# Patient Record
Sex: Female | Born: 1965 | Race: White | Hispanic: No | Marital: Married | Smoking: Never smoker
Health system: Southern US, Community
[De-identification: ages and names within clinical notes are randomized; demographics above are authoritative.]

## PROBLEM LIST (undated history)

## (undated) DIAGNOSIS — M419 Scoliosis, unspecified: Secondary | ICD-10-CM

## (undated) DIAGNOSIS — N939 Abnormal uterine and vaginal bleeding, unspecified: Secondary | ICD-10-CM

## (undated) DIAGNOSIS — T4145XA Adverse effect of unspecified anesthetic, initial encounter: Secondary | ICD-10-CM

## (undated) DIAGNOSIS — Z9889 Other specified postprocedural states: Secondary | ICD-10-CM

## (undated) DIAGNOSIS — T8859XA Other complications of anesthesia, initial encounter: Secondary | ICD-10-CM

## (undated) DIAGNOSIS — R112 Nausea with vomiting, unspecified: Secondary | ICD-10-CM

## (undated) HISTORY — PX: DILATION AND CURETTAGE OF UTERUS: SHX78

---

## 2005-09-15 ENCOUNTER — Ambulatory Visit: Payer: Self-pay

## 2007-09-06 ENCOUNTER — Ambulatory Visit: Payer: Self-pay

## 2009-09-23 ENCOUNTER — Ambulatory Visit: Payer: Self-pay

## 2010-11-07 ENCOUNTER — Ambulatory Visit: Payer: Self-pay

## 2010-11-14 ENCOUNTER — Ambulatory Visit: Payer: Self-pay

## 2011-05-18 ENCOUNTER — Ambulatory Visit: Payer: Self-pay

## 2011-12-24 ENCOUNTER — Ambulatory Visit: Payer: Self-pay

## 2012-12-27 ENCOUNTER — Ambulatory Visit: Payer: Self-pay

## 2013-01-10 ENCOUNTER — Ambulatory Visit: Payer: Self-pay

## 2013-09-20 ENCOUNTER — Ambulatory Visit: Payer: Self-pay | Admitting: Obstetrics and Gynecology

## 2013-09-20 LAB — CBC
HGB: 11.5 g/dL — ABNORMAL LOW (ref 12.0–16.0)
MCHC: 34.7 g/dL (ref 32.0–36.0)
MCV: 86 fL (ref 80–100)
Platelet: 386 10*3/uL (ref 150–440)
RDW: 12.2 % (ref 11.5–14.5)
WBC: 7.5 10*3/uL (ref 3.6–11.0)

## 2013-09-20 LAB — BASIC METABOLIC PANEL
Anion Gap: 3 — ABNORMAL LOW (ref 7–16)
BUN: 12 mg/dL (ref 7–18)
Calcium, Total: 8.9 mg/dL (ref 8.5–10.1)
Chloride: 107 mmol/L (ref 98–107)
Creatinine: 0.71 mg/dL (ref 0.60–1.30)
EGFR (Non-African Amer.): >60
Glucose: 82 mg/dL (ref 65–99)
Osmolality: 275 (ref 275–301)
Potassium: 3.7 mmol/L (ref 3.5–5.1)

## 2013-09-26 ENCOUNTER — Ambulatory Visit: Payer: Self-pay | Admitting: Obstetrics and Gynecology

## 2014-01-29 ENCOUNTER — Ambulatory Visit: Payer: Self-pay | Admitting: Obstetrics and Gynecology

## 2015-03-01 NOTE — Op Note (Signed)
PATIENT NAME:  Kathleen Moses, Kathleen Moses MR#:  [id number] DATE OF BIRTH:  Nov 18, 1965  DATE OF PROCEDURE:  09/26/2013  PREOPERATIVE DIAGNOSES:  1.  Menorrhagia with endometrial polyp noted on office ultrasound. 2.  Pelvic pain.  3.  Infertility.  POSTOPERATIVE DIAGNOSES: 1.  Menorrhagia, without evidence of endometrial polyp. 2.  Pelvic pain. 3.  Infertility.   OPERATION PERFORMED:  1.  Hysteroscopy.  2.  Dilatation and curettage. 3.  Diagnostic laparoscopy with chromopertubation.   ANESTHESIA USED: General.   PRIMARY SURGEON: Vena AustriaAndreas Naomie Crow, MD.   ESTIMATED BLOOD LOSS: Minimal.   COMPLICATIONS: None.   FINDINGS: Normal intra-abdominal anatomy. Free spill of methylene blue dye on the left tube, none noted on the right tube. The right tube was grossly normal in appearance other than a small cyst of Morganii near the fimbriated end. The remainder of the intra-abdominal anatomy was grossly normal with no evidence of endometriosis. The hysteroscopy portion of the case revealed fluffy, shaggy-appearing endometrium without evidence of endometrial polyp. Following the dilation and curettage, the cavity was assessed and noted to be normal in contour as was the cervical canal.   SPECIMENS REMOVED: Endometrial curettings.   PATIENT CONDITION FOLLOWING PROCEDURE: Stable.   PROCEDURE IN DETAIL: Risks, benefits, and alternatives of the procedure were discussed with the patient prior to proceeding to the operating room. The patient was taken to the operating room where she was placed under general endotracheal anesthesia. She was positioned in the dorsal lithotomy position using Allen stirrups, prepped and draped in the usual sterile fashion. A timeout was performed. Attention was turned to the patient's pelvis. The bladder was straight catheterized with a red rubber catheter. An operative speculum was then placed. The anterior lip of the cervix was grasped with a single-tooth tenaculum and an acorn cannula  was placed through the cervical os. The operative speculum was removed.   Attention was turned to the patient's abdomen. The umbilicus was incised with 11 blade scalpel and peritoneal entry was gained using direct visual visualization technique with a 5 mm XL trocar. Once entry into the peritoneal cavity had been established, pneumoperitoneum was established. A left 5 mm assistant port was then placed under direct visualization noting the above findings. Chromopertubation revealed pre-spill on the left tube and no spill was noted on the right tube, although the right tube is grossly normal in appearance. Following this, the pneumoperitoneum was evacuated, the trocars were removed and the trocar sites were dressed with Dermabond.   Attention was once again turned to the patient's pelvis. The single-tooth tenaculum and acorn cannula were removed. The cervix was sequentially dilated using Hegar dilators. A hysteroscopy was then performed. The initial hysteroscopy was somewhat limited because of fluffy, shaggy appearance of the endometrium. A D and C yielded moderate amounts of tissue. Post D and C hysteroscopy revealed normal cavity contour. Both tubal ostia were visualized and appeared normal. The cervix was also normal in appearance. The patient tolerated the procedure well and was taken to the recovery room in stable condition.   ____________________________ Florina OuAndreas M. Bonney AidStaebler, MD ams:aw D: 09/27/2013 08:22:12 ET T: 09/27/2013 08:48:06 ET JOB#: [id number]  cc: Florina OuAndreas M. Bonney AidStaebler, MD, <Dictator> Carmel SacramentoANDREAS Cathrine MusterM Rodriques Badie MD ELECTRONICALLY SIGNED 10/09/2013 8:46

## 2015-08-14 ENCOUNTER — Other Ambulatory Visit: Payer: Self-pay | Admitting: Obstetrics and Gynecology

## 2015-08-14 DIAGNOSIS — Z1231 Encounter for screening mammogram for malignant neoplasm of breast: Secondary | ICD-10-CM

## 2015-08-16 ENCOUNTER — Ambulatory Visit
Admission: RE | Admit: 2015-08-16 | Discharge: 2015-08-16 | Disposition: A | Discharge status: Home / Self Care | Payer: BC Managed Care – PPO | Source: Ambulatory Visit | Attending: Obstetrics and Gynecology | Admitting: Obstetrics and Gynecology

## 2015-08-16 DIAGNOSIS — Z1231 Encounter for screening mammogram for malignant neoplasm of breast: Secondary | ICD-10-CM | POA: Diagnosis present

## 2016-08-27 ENCOUNTER — Other Ambulatory Visit: Payer: Self-pay | Admitting: Obstetrics and Gynecology

## 2016-08-27 DIAGNOSIS — Z1231 Encounter for screening mammogram for malignant neoplasm of breast: Secondary | ICD-10-CM

## 2016-09-08 ENCOUNTER — Encounter: Payer: Self-pay | Admitting: Radiology

## 2016-09-08 ENCOUNTER — Ambulatory Visit
Admission: RE | Admit: 2016-09-08 | Discharge: 2016-09-08 | Disposition: A | Discharge status: Home / Self Care | Payer: BC Managed Care – PPO | Source: Ambulatory Visit | Attending: Obstetrics and Gynecology | Admitting: Obstetrics and Gynecology

## 2016-09-08 DIAGNOSIS — Z1231 Encounter for screening mammogram for malignant neoplasm of breast: Secondary | ICD-10-CM | POA: Insufficient documentation

## 2016-11-09 DIAGNOSIS — N939 Abnormal uterine and vaginal bleeding, unspecified: Secondary | ICD-10-CM

## 2016-11-09 HISTORY — DX: Abnormal uterine and vaginal bleeding, unspecified: N93.9

## 2016-12-10 ENCOUNTER — Encounter: Payer: Self-pay | Admitting: *Deleted

## 2016-12-10 ENCOUNTER — Ambulatory Visit
Admission: EM | Admit: 2016-12-10 | Discharge: 2016-12-10 | Disposition: A | Discharge status: Home / Self Care | Payer: BC Managed Care – PPO | Attending: Family Medicine | Admitting: Family Medicine

## 2016-12-10 DIAGNOSIS — J069 Acute upper respiratory infection, unspecified: Secondary | ICD-10-CM

## 2016-12-10 DIAGNOSIS — J040 Acute laryngitis: Secondary | ICD-10-CM

## 2016-12-10 NOTE — ED Triage Notes (Signed)
Non-productive cough, loss of voice, congestion, x2 days. Denies fever.

## 2016-12-10 NOTE — Discharge Instructions (Signed)
Recommend continue Vicks Vapor Rub and moist heat/steam to help with symptoms. May use throat lozenges as needed. Recommend follow-up with your primary care provider in 4 to 5 days if not improving or if symptoms worsen.

## 2016-12-10 NOTE — ED Provider Notes (Signed)
CSN: [id number]     Arrival date & time 12/10/16  1127 History   First MD Initiated Contact with Patient 12/10/16 1207     Chief Complaint  Patient presents with  . Cough  . Nasal Congestion  . Hoarse   (Consider location/radiation/quality/duration/timing/severity/associated sxs/prior Treatment) 51 year old female presents with sneezing, loss of voice, slight nasal congestion and dry cough for the past 2 days. Denies any fever, sore throat, or GI symptoms. Has used Vicks Vapor Rub with some relief. Husband has been sick with more severe URI symptoms but his symptoms are resolving. She has a history of recurrent sinus infections but otherwise no chronic health issues.    The history is provided by the patient.    History reviewed. No pertinent past medical history. Past Surgical History:  Procedure Laterality Date  . DILATION AND CURETTAGE OF UTERUS     Family History  Problem Relation Age of Onset  . Breast cancer Neg Hx    Social History  Substance Use Topics  . Smoking status: Never Smoker  . Smokeless tobacco: Never Used  . Alcohol use No   OB History    No data available     Review of Systems  Constitutional: Negative for appetite change, chills, fatigue and fever.  HENT: Positive for congestion, postnasal drip, sneezing and voice change. Negative for ear pain, rhinorrhea, sinus pain, sinus pressure and sore throat.   Eyes: Negative for discharge.  Respiratory: Positive for cough. Negative for chest tightness, shortness of breath and wheezing.   Cardiovascular: Negative for chest pain.  Gastrointestinal: Negative for diarrhea, nausea and vomiting.  Musculoskeletal: Negative for arthralgias, myalgias, neck pain and neck stiffness.  Skin: Negative for rash.  Neurological: Negative for dizziness, syncope, weakness, light-headedness, numbness and headaches.  Hematological: Negative for adenopathy.    Allergies  Patient has no known allergies.  Home Medications    Prior to Admission medications   Medication Sig Start Date End Date Taking? Authorizing Provider  cholecalciferol (VITAMIN D) 1000 units tablet Take 1,000 Units by mouth daily.   Yes Historical Provider, MD  loratadine (CLARITIN) 10 MG tablet Take 10 mg by mouth daily.   Yes Historical Provider, MD   Meds Ordered and Administered this Visit  Medications - No data to display  BP 117/73 (BP Location: Left Arm)   Pulse 73   Temp 99.1 F (37.3 C) (Oral)   Resp 16   Ht 5\' 2"  (1.575 m)   Wt 128 lb (58.1 kg)   SpO2 100%   BMI 23.41 kg/m  No data found.   Physical Exam  Constitutional: She is oriented to person, place, and time. She appears well-developed and well-nourished. She does not appear ill. No distress.  HENT:  Head: Normocephalic and atraumatic.  Right Ear: Hearing, tympanic membrane, external ear and ear canal normal.  Left Ear: Hearing, tympanic membrane, external ear and ear canal normal.  Nose: Nose normal. Right sinus exhibits no maxillary sinus tenderness and no frontal sinus tenderness. Left sinus exhibits no maxillary sinus tenderness and no frontal sinus tenderness.  Mouth/Throat: Uvula is midline and mucous membranes are normal. No posterior oropharyngeal erythema (slight irritation- not red).  Neck: Normal range of motion. Neck supple.  Cardiovascular: Normal rate, regular rhythm and normal heart sounds.   Pulmonary/Chest: Effort normal and breath sounds normal. No respiratory distress. She has no decreased breath sounds. She has no wheezes. She has no rhonchi. She has no rales.  Lymphadenopathy:    She  has no cervical adenopathy.  Neurological: She is alert and oriented to person, place, and time.  Skin: Skin is warm and dry. Capillary refill takes less than 2 seconds.  Psychiatric: She has a normal mood and affect. Her behavior is normal. Judgment and thought content normal.    Urgent Care Course     Procedures (including critical care time)  Labs  Review Labs Reviewed - No data to display  Imaging Review No results found.   Visual Acuity Review  Right Eye Distance:   Left Eye Distance:   Bilateral Distance:    Right Eye Near:   Left Eye Near:    Bilateral Near:         MDM   1. Laryngitis   2. Acute upper respiratory infection    Discussed with patient that she appears to have a mild viral illness. Did not perform a strep test since throat is not sore, no fever or swollen lymph nodes. Reassurance provided that laryngitis should improve over the next few days. Recommend continue Vicks Vapor Rub and moist heat/steam to help with symptoms. May use throat lozenges as needed. Follow-up with her primary care provider in 4 to 5 days if not improving.      Sudie Grumbling, NP 12/10/16 2139

## 2017-04-06 ENCOUNTER — Encounter: Payer: Self-pay | Admitting: Obstetrics and Gynecology

## 2017-04-06 ENCOUNTER — Ambulatory Visit (INDEPENDENT_AMBULATORY_CARE_PROVIDER_SITE_OTHER): Payer: BC Managed Care – PPO | Admitting: Obstetrics and Gynecology

## 2017-04-06 VITALS — BP 110/70 | HR 77 | Ht 62.0 in | Wt 131.0 lb

## 2017-04-06 DIAGNOSIS — N930 Postcoital and contact bleeding: Secondary | ICD-10-CM

## 2017-04-06 DIAGNOSIS — N924 Excessive bleeding in the premenopausal period: Secondary | ICD-10-CM | POA: Diagnosis not present

## 2017-04-06 NOTE — Progress Notes (Signed)
Obstetrics & Gynecology Office Visit   Chief Complaint:  Chief Complaint  Patient presents with  . Metrorrhagia    no pain/no heavy/no clots    History of Present Illness: 51 year old female presenting for irregular vaginal bleeding.  In past year had noted menses spacing out somewhat before returning to regular monthly.  Now has noted longer episodes of bleeding with duration lasting up to two week, no heavy, at times only bright red spotting, no associated dysmenorrhea.  Interval has also shortened sometime to as little as 21 days.  She does report some postcoital spotting.  No intermenstrual bleeding.  Her past medical history is notable for prior D&C for endometrial polyps.  Last pathology was obtained 2016 showing proliferative endometrium with benign endometrial polyp.  Pap 08/25/16 NIL HPV negative.  Some mild vasomotor symptoms   Review of Systems: 10 point review of systems negative unless otherwise noted in HPI  Past Medical History:  History reviewed. No pertinent past medical history.  Past Surgical History:  Past Surgical History:  Procedure Laterality Date  . DILATION AND CURETTAGE OF UTERUS      Gynecologic History: No LMP recorded. Patient is perimenopausal.  Obstetric History: G0P0000  Family History:  Family History  Problem Relation Age of Onset  . Breast cancer Neg Hx     Social History:  Social History   Social History  . Marital status: Married    Spouse name: N/A  . Number of children: N/A  . Years of education: N/A   Occupational History  . Not on file.   Social History Main Topics  . Smoking status: Never Smoker  . Smokeless tobacco: Never Used  . Alcohol use No  . Drug use: No  . Sexual activity: Yes    Birth control/ protection: None   Other Topics Concern  . Not on file   Social History Narrative  . No narrative on file    Allergies:  No Known Allergies  Medications: Prior to Admission medications   Medication Sig  Start Date End Date Taking? Authorizing Provider  aspirin EC 81 MG tablet Take by mouth.   Yes [provider]  cetirizine (ZYRTEC) 10 MG tablet Take by mouth.   Yes [provider]  cholecalciferol (VITAMIN D) 1000 units tablet Take 1,000 Units by mouth daily.   Yes [provider]  ibuprofen (ADVIL,MOTRIN) 200 MG tablet Take 200 mg by mouth.   Yes [provider]  loratadine (CLARITIN) 10 MG tablet Take 10 mg by mouth daily.   Yes [provider]    Physical Exam Vitals:  Vitals:   04/06/17 1408  BP: 110/70  Pulse: 77   No LMP recorded. Patient is perimenopausal.  General: NAD HEENT: normocephalic, anicteric Pulmonary: No increased work of breathing Genitourinary:  External: Normal external female genitalia.  Normal urethral meatus, normal  Bartholin's and Skene's glands.    Vagina: Normal vaginal mucosa, no evidence of prolapse.    Cervix: Grossly normal in appearance, no bleeding  Uterus: Non-enlarged, mobile, normal contour.  No CMT  Adnexa: ovaries non-enlarged, no adnexal masses  Rectal: deferred  Lymphatic: no evidence of inguinal lymphadenopathy Extremities: no edema, erythema, or tenderness Neurologic: Grossly intact Psychiatric: mood appropriate, affect full  Female chaperone present for pelvic and breast  portions of the physical exam  Assessment: 51 y.o. G0P0000 with menorrhagia  Plan: Problem List Items Addressed This Visit    None    Visit Diagnoses  Perimenopausal menorrhagia    -  Primary   Relevant Orders   US Transvaginal Non-OB   Postcoital and contact bleeding       Relevant Orders   US Transvaginal Non-OB     - Will start work up with TVUS, recommended endometrial biopsy of no focal lesion identified unless stripe is less than 4mm.

## 2017-04-26 ENCOUNTER — Ambulatory Visit (INDEPENDENT_AMBULATORY_CARE_PROVIDER_SITE_OTHER): Payer: BC Managed Care – PPO | Admitting: Obstetrics and Gynecology

## 2017-04-26 ENCOUNTER — Encounter: Payer: Self-pay | Admitting: Obstetrics and Gynecology

## 2017-04-26 ENCOUNTER — Ambulatory Visit (INDEPENDENT_AMBULATORY_CARE_PROVIDER_SITE_OTHER): Payer: BC Managed Care – PPO

## 2017-04-26 VITALS — BP 118/78 | HR 84 | Wt 130.0 lb

## 2017-04-26 DIAGNOSIS — N924 Excessive bleeding in the premenopausal period: Secondary | ICD-10-CM

## 2017-04-26 DIAGNOSIS — N930 Postcoital and contact bleeding: Secondary | ICD-10-CM

## 2017-04-26 DIAGNOSIS — N840 Polyp of corpus uteri: Secondary | ICD-10-CM

## 2017-04-26 NOTE — Progress Notes (Signed)
Gynecology Ultrasound Follow Up  Chief Complaint:  Chief Complaint  Patient presents with  . Gyn U/S     History of Present Illness: Patient is a 51 y.o. female who presents today for ultrasound evaluation of abnormal uterine/postcoital bleeding .  Ultrasound demonstrates the following findgins Adnexa: no masses seen consistency normal Uterus: Non-enlarged with endometrial stripe  8mm and what appears to be a small LUS endometrial polyp Additional: no free fluid  Review of Systems: Review of Systems  Constitutional: Negative for chills and fever.  HENT: Negative for congestion.   Respiratory: Negative for cough and shortness of breath.   Cardiovascular: Negative for chest pain and palpitations.  Gastrointestinal: Negative for abdominal pain, constipation, diarrhea, heartburn, nausea and vomiting.  Genitourinary: Negative for dysuria, frequency and urgency.  Skin: Negative for itching and rash.  Neurological: Negative for dizziness and headaches.  Endo/Heme/Allergies: Negative for polydipsia.  Psychiatric/Behavioral: Negative for depression.    Past Medical History:  No past medical history on file.  Past Surgical History:  Past Surgical History:  Procedure Laterality Date  . DILATION AND CURETTAGE OF UTERUS      Gynecologic History:  No LMP recorded. Patient is perimenopausal. Contraception: none Last Pap: 08/25/16. Results were: .NIL HPV negative  Family History:  Family History  Problem Relation Age of Onset  . Breast cancer Neg Hx     Social History:  Social History   Social History  . Marital status: Married    Spouse name: N/A  . Number of children: N/A  . Years of education: N/A   Occupational History  . Not on file.   Social History Main Topics  . Smoking status: Never Smoker  . Smokeless tobacco: Never Used  . Alcohol use No  . Drug use: No  . Sexual activity: Yes    Birth control/ protection: None   Other Topics Concern  . Not on  file   Social History Narrative  . No narrative on file    Allergies:  No Known Allergies  Medications: Prior to Admission medications   Medication Sig Start Date End Date Taking? Authorizing Provider  aspirin EC 81 MG tablet Take by mouth.    [provider]  cetirizine (ZYRTEC) 10 MG tablet Take by mouth.    [provider]  cholecalciferol (VITAMIN D) 1000 units tablet Take 1,000 Units by mouth daily.    [provider]  ibuprofen (ADVIL,MOTRIN) 200 MG tablet Take 200 mg by mouth.    [provider]  loratadine (CLARITIN) 10 MG tablet Take 10 mg by mouth daily.    [provider]    Physical Exam Vitals: Blood pressure 118/78, pulse 84, weight 130 lb (59 kg).  General: NAD HEENT: normocephalic, anicteric Pulmonary: No increased work of breathing Extremities: no edema, erythema, or tenderness Neurologic: Grossly intact, normal gait Psychiatric: mood appropriate, affect full   Assessment: 51 y.o. G0P0000 with endometrial polyp  Plan: Problem List Items Addressed This Visit    None    Visit Diagnoses    Endometrial polyp    -  Primary   Postcoital and contact bleeding          1) Endometrial polyp - history of prior polypectomy 3 years ago.  Post for hysteroscopy, D&C to evaluate focal lesion seen on ultrasound 2)A total of 15 minutes were spent in face-to-face contact with the patient during this encounter with over half of that time devoted to counseling and coordination of care.  Post for hysteroscopy D&C

## 2017-04-27 ENCOUNTER — Telehealth: Payer: Self-pay | Admitting: Obstetrics and Gynecology

## 2017-04-27 NOTE — Telephone Encounter (Signed)
Patient was given available OR dates. She will check her work schedule and call back. Patient was given my ext.

## 2017-04-27 NOTE — Telephone Encounter (Signed)
-----   Message from Andreas Staebler, MD sent at 04/27/2017  1:22 PM EDT ----- °Regarding: surgery °Surgery Date: 2-6 weeks ° °LOS: outpatient ° °Surgery Booking Request °Patient Full Name: Kathleen Moses, Kathleen Moses °MRN: [id number]  °DOB: 01/15/1966  °Surgeon: Andreas Staebler, MD  °Requested Surgery Date and Time: 2-6 weeks °Primary Diagnosis and Code: N84.0 °Secondary Diagnosis and Code:  °Surgical Procedure: Hysteroscopy, D&C °L&D Notification:N/A °Admission Status: same day surgery °Length of Surgery: 1hr °Special Case Needs: none °H&P:  (date) °Phone Interview or Office Pre-Admit: phone °Interpreter: none °Language: english °Medical Clearance: none °Special Scheduling Instructions: none ° °

## 2017-04-28 NOTE — Telephone Encounter (Signed)
Patient decided to have surgery on Friday, 05/07/17. Dr Bonney AidStaebler will get the h&p and consents on day of surgery and Pre-admit Testing will do a phone interview.

## 2017-04-28 NOTE — Telephone Encounter (Signed)
-----   Message from Vena AustriaAndreas Staebler, MD sent at 04/27/2017  1:22 PM EDT ----- Regarding: surgery Surgery Date: 2-6 weeks  LOS: outpatient  Surgery Booking Request Patient Full Name: Kathleen Moses, Kathleen Moses MRN: [id number]  DOB: 10/05/1966  Surgeon: Vena AustriaAndreas Staebler, MD  Requested Surgery Date and Time: 2-6 weeks Primary Diagnosis and Code: N84.0 Secondary Diagnosis and Code:  Surgical Procedure: Hysteroscopy, D&C L&D Notification:N/A Admission Status: same day surgery Length of Surgery: 1hr Special Case Needs: none H&P:  (date) Phone Interview or Office Pre-Admit: phone Interpreter: none Language: english Medical Clearance: none Special Scheduling Instructions: none

## 2017-04-30 ENCOUNTER — Encounter
Admission: RE | Admit: 2017-04-30 | Discharge: 2017-04-30 | Disposition: A | Discharge status: Home / Self Care | Payer: BC Managed Care – PPO | Source: Ambulatory Visit | Attending: Obstetrics and Gynecology | Admitting: Obstetrics and Gynecology

## 2017-04-30 HISTORY — DX: Scoliosis, unspecified: M41.9

## 2017-04-30 HISTORY — DX: Adverse effect of unspecified anesthetic, initial encounter: T41.45XA

## 2017-04-30 HISTORY — DX: Other complications of anesthesia, initial encounter: T88.59XA

## 2017-04-30 NOTE — Patient Instructions (Signed)
  Your procedure is scheduled on: 05-07-17 Report to Same Day Surgery 2nd floor medical mall [facility](Medical Mall Entrance-take elevator on left to 2nd floor.  Check in with surgery information desk.) To find out your arrival time please call [phone] between 1PM - 3PM on 05-06-17  Remember: Instructions that are not followed completely may result in serious medical risk, up to and including death, or upon the discretion of your surgeon and anesthesiologist your surgery may need to be rescheduled.    _x___ 1. Do not eat food or drink liquids after midnight. No gum chewing or                              hard candies.     __x__ 2. No Alcohol for 24 hours before or after surgery.   __x__3. No Smoking for 24 prior to surgery.   ____  4. Bring all medications with you on the day of surgery if instructed.    __x__ 5. Notify your doctor if there is any change in your medical condition     (cold, fever, infections).     Do not wear jewelry, make-up, hairpins, clips or nail polish.  Do not wear lotions, powders, or perfumes. You may wear deodorant.  Do not shave 48 hours prior to surgery. Men may shave face and neck.  Do not bring valuables to the hospital.    [facility] not [facility] for any belongings or valuables.               Contacts, dentures or bridgework may not be worn into surgery.  Leave your suitcase in the car. After surgery it may be brought to your room.  For patients admitted to the hospital, discharge time is determined by your                       treatment team.   Patients discharged the day of surgery will not be allowed to drive home.  You will need someone to drive you home and stay with you the night of your procedure.    Please read over the following fact sheets that you were given:   [facility] Preparing [facility] and or MRSA Information   ____ Take anti-hypertensive (unless it includes a diuretic), cardiac, seizure, asthma,     anti-reflux and psychiatric  medicines. These include:  1. NONE  2.  3.  4.  5.  6.  ____Fleets enema or Magnesium Citrate as directed.   ____ Use CHG Soap or sage wipes as directed on instruction sheet   ____ Use inhalers on the day of surgery and bring to hospital day of surgery  ____ Stop Metformin and Janumet 2 days prior to surgery.    ____ Take 1/2 of usual insulin dose the night before surgery and none on the morning     surgery.   _x___ Follow recommendations from Cardiologist, Pulmonologist or PCP regarding          stopping Aspirin, Coumadin, Pllavix ,Eliquis, Effient, or Pradaxa, and Pletal-STOP ASPIRIN NOW  X____Stop Anti-inflammatories such as Advil, Aleve, IBUPROFEN, Motrin, Naproxen, Naprosyn, Goodies powders or aspirin products NOW-OK to take Tylenol    ____ Stop supplements until after surgery.   ____ Bring C-Pap to the hospital.

## 2017-05-07 ENCOUNTER — Encounter
Admission: RE | Disposition: A | Discharge status: Home / Self Care | Payer: Self-pay | Source: Ambulatory Visit | Attending: Obstetrics and Gynecology

## 2017-05-07 ENCOUNTER — Ambulatory Visit: Payer: BC Managed Care – PPO | Admitting: Anesthesiology

## 2017-05-07 ENCOUNTER — Encounter: Payer: Self-pay | Admitting: *Deleted

## 2017-05-07 ENCOUNTER — Ambulatory Visit
Admission: RE | Admit: 2017-05-07 | Discharge: 2017-05-07 | Disposition: A | Discharge status: Home / Self Care | Payer: BC Managed Care – PPO | Source: Ambulatory Visit | Attending: Obstetrics and Gynecology | Admitting: Obstetrics and Gynecology

## 2017-05-07 DIAGNOSIS — M419 Scoliosis, unspecified: Secondary | ICD-10-CM | POA: Diagnosis not present

## 2017-05-07 DIAGNOSIS — Z7982 Long term (current) use of aspirin: Secondary | ICD-10-CM | POA: Insufficient documentation

## 2017-05-07 DIAGNOSIS — N930 Postcoital and contact bleeding: Secondary | ICD-10-CM | POA: Diagnosis not present

## 2017-05-07 DIAGNOSIS — Z9889 Other specified postprocedural states: Secondary | ICD-10-CM

## 2017-05-07 DIAGNOSIS — Z791 Long term (current) use of non-steroidal anti-inflammatories (NSAID): Secondary | ICD-10-CM | POA: Diagnosis not present

## 2017-05-07 DIAGNOSIS — N840 Polyp of corpus uteri: Secondary | ICD-10-CM | POA: Diagnosis not present

## 2017-05-07 HISTORY — PX: HYSTEROSCOPY WITH D & C: SHX1775

## 2017-05-07 HISTORY — DX: Abnormal uterine and vaginal bleeding, unspecified: N93.9

## 2017-05-07 LAB — POCT PREGNANCY, URINE: PREG TEST UR: NEGATIVE

## 2017-05-07 SURGERY — DILATATION AND CURETTAGE /HYSTEROSCOPY
Anesthesia: General

## 2017-05-07 MED ORDER — ONDANSETRON HCL 4 MG/2ML IJ SOLN: 4.0000 mg | Freq: Once | INTRAMUSCULAR | Status: DC | PRN

## 2017-05-07 MED ORDER — GLYCOPYRROLATE 0.2 MG/ML IJ SOLN
INTRAMUSCULAR | Status: DC | PRN
  Administered 2017-05-07: 0.2 mg via INTRAVENOUS

## 2017-05-07 MED ORDER — LIDOCAINE HCL (PF) 2 % IJ SOLN
INTRAMUSCULAR | Status: AC
  Filled 2017-05-07: qty 2

## 2017-05-07 MED ORDER — FENTANYL CITRATE (PF) 100 MCG/2ML IJ SOLN
INTRAMUSCULAR | Status: DC | PRN
  Administered 2017-05-07: 25 ug via INTRAVENOUS
  Administered 2017-05-07: 50 ug via INTRAVENOUS

## 2017-05-07 MED ORDER — SCOPOLAMINE 1 MG/3DAYS TD PT72
MEDICATED_PATCH | TRANSDERMAL | Status: AC
  Administered 2017-05-07: 1.5 mg
  Filled 2017-05-07: qty 1

## 2017-05-07 MED ORDER — FENTANYL CITRATE (PF) 100 MCG/2ML IJ SOLN
INTRAMUSCULAR | Status: AC
  Filled 2017-05-07: qty 2

## 2017-05-07 MED ORDER — LIDOCAINE HCL 2 % EX GEL
CUTANEOUS | Status: AC
  Filled 2017-05-07: qty 5

## 2017-05-07 MED ORDER — FAMOTIDINE 20 MG PO TABS
ORAL_TABLET | ORAL | Status: AC
  Administered 2017-05-07: 20 mg via ORAL
  Filled 2017-05-07: qty 1

## 2017-05-07 MED ORDER — LIDOCAINE HCL (CARDIAC) 20 MG/ML IV SOLN
INTRAVENOUS | Status: DC | PRN
  Administered 2017-05-07: 40 mg via INTRAVENOUS

## 2017-05-07 MED ORDER — SCOPOLAMINE 1 MG/3DAYS TD PT72: 1.0000 | MEDICATED_PATCH | TRANSDERMAL | Status: DC

## 2017-05-07 MED ORDER — MIDAZOLAM HCL 2 MG/2ML IJ SOLN
INTRAMUSCULAR | Status: AC
  Filled 2017-05-07: qty 2

## 2017-05-07 MED ORDER — PROPOFOL 10 MG/ML IV BOLUS
INTRAVENOUS | Status: AC
  Filled 2017-05-07: qty 20

## 2017-05-07 MED ORDER — HYDROCODONE-ACETAMINOPHEN 5-325 MG PO TABS: 1.0000 | ORAL_TABLET | ORAL | 0 refills | Status: DC | PRN

## 2017-05-07 MED ORDER — FENTANYL CITRATE (PF) 100 MCG/2ML IJ SOLN: 25.0000 ug | INTRAMUSCULAR | Status: DC | PRN

## 2017-05-07 MED ORDER — LACTATED RINGERS IV SOLN
INTRAVENOUS | Status: DC
  Administered 2017-05-07: 50 mL/h via INTRAVENOUS
  Administered 2017-05-07: 13:00:00 via INTRAVENOUS

## 2017-05-07 MED ORDER — SCOPOLAMINE 1 MG/3DAYS TD PT72: 1.0000 | MEDICATED_PATCH | TRANSDERMAL | 12 refills | Status: DC

## 2017-05-07 MED ORDER — PROPOFOL 10 MG/ML IV BOLUS
INTRAVENOUS | Status: DC | PRN
  Administered 2017-05-07: 20 mg via INTRAVENOUS
  Administered 2017-05-07: 110 mg via INTRAVENOUS

## 2017-05-07 MED ORDER — MIDAZOLAM HCL 2 MG/2ML IJ SOLN
INTRAMUSCULAR | Status: DC | PRN
  Administered 2017-05-07: 2 mg via INTRAVENOUS

## 2017-05-07 MED ORDER — IBUPROFEN 600 MG PO TABS: 600.0000 mg | ORAL_TABLET | Freq: Four times a day (QID) | ORAL | 0 refills | Status: DC | PRN

## 2017-05-07 MED ORDER — FAMOTIDINE 20 MG PO TABS
20.0000 mg | ORAL_TABLET | Freq: Once | ORAL | Status: AC
  Administered 2017-05-07: 20 mg via ORAL

## 2017-05-07 SURGICAL SUPPLY — 15 items
CATH ROBINSON RED A/P 16FR (CATHETERS) ×3 IMPLANT
ELECT REM PT RETURN 9FT ADLT (ELECTROSURGICAL) ×3
ELECTRODE REM PT RTRN 9FT ADLT (ELECTROSURGICAL) ×1 IMPLANT
GLOVE BIO SURGEON STRL SZ7 (GLOVE) ×3 IMPLANT
GLOVE INDICATOR 7.5 STRL GRN (GLOVE) ×3 IMPLANT
GOWN STRL REUS W/ TWL LRG LVL3 (GOWN DISPOSABLE) ×2 IMPLANT
GOWN STRL REUS W/TWL LRG LVL3 (GOWN DISPOSABLE) ×4
IV LACTATED RINGERS 1000ML (IV SOLUTION) ×3 IMPLANT
KIT RM TURNOVER CYSTO AR (KITS) ×3 IMPLANT
PACK DNC HYST (MISCELLANEOUS) ×3 IMPLANT
PAD OB MATERNITY 4.3X12.25 (PERSONAL CARE ITEMS) ×3 IMPLANT
PAD PREP 24X41 OB/GYN DISP (PERSONAL CARE ITEMS) ×3 IMPLANT
TOWEL OR 17X26 4PK STRL BLUE (TOWEL DISPOSABLE) ×3 IMPLANT
TUBING CONNECTING 10 (TUBING) ×2 IMPLANT
TUBING CONNECTING 10' (TUBING) ×1

## 2017-05-07 NOTE — Anesthesia Procedure Notes (Signed)
Date/Time: 05/07/2017 1:16 PM Performed by: Christalyn Goertz Ventilation: Nasal airway inserted- appropriate to patient size

## 2017-05-07 NOTE — H&P (Signed)
Obstetrics & Gynecology Surgery H&P    Chief Complaint: Scheduled Surgery   History of Present Illness: Patient is a 51 y.o. G0P0000 presenting for scheduled hysteroscopy, dilation and curettage, for the treatment or further evaluation of focal endometrial lesion on ultrasound.   Prior Treatments prior to proceeding with surgery include: ultrasound work up for postcoital spotting  Preoperative Pap:08/25/16 Results: NIL HPV negative Preoperative Ultrasound: 04/26/17 Findings: small endometrial defect which given history of prior endometrial polyp likely represents a polyp   Review of Systems:10 point review of systems  Past Medical History:  Past Medical History:  Diagnosis Date  . Complication of anesthesia    VOMITED X 1 AFTER D&C 2015  . Scoliosis    MILD-DX IN 6TH GRADE    Past Surgical History:  Past Surgical History:  Procedure Laterality Date  . DILATION AND CURETTAGE OF UTERUS      Family History:  Family History  Problem Relation Age of Onset  . Breast cancer Neg Hx     Social History:  Social History   Social History  . Marital status: Married    Spouse name: N/A  . Number of children: N/A  . Years of education: N/A   Occupational History  . Not on file.   Social History Main Topics  . Smoking status: Never Smoker  . Smokeless tobacco: Never Used  . Alcohol use No  . Drug use: No  . Sexual activity: Yes    Birth control/ protection: None   Other Topics Concern  . Not on file   Social History Narrative  . No narrative on file    Allergies:  No Known Allergies  Medications: Prior to Admission medications   Medication Sig Start Date End Date Taking? Authorizing Provider  aspirin EC 81 MG tablet Take 81 mg by mouth daily as needed for mild pain.    Yes [provider]  cholecalciferol (VITAMIN D) 1000 units tablet Take 1,000 Units by mouth daily.   Yes [provider]  ibuprofen (ADVIL,MOTRIN) 200 MG tablet Take  200 mg by mouth every 8 (eight) hours as needed for mild pain or moderate pain.    Yes [provider]  loratadine (CLARITIN) 10 MG tablet Take 10 mg by mouth daily as needed for allergies.    Yes [provider]    Physical Exam Vitals: Last menstrual period 04/29/2017. General: NAD HEENT: normocephalic, anicteric Pulmonary:CTAB, No increased work of breathing Cardiovascular: RRR, distal pulses 2+ Abdomen: Soft, non-tender, non-distended Extremities: no edema, erythema, or tenderness Neurologic: Grossly intact Psychiatric: mood appropriate, affect full  Imaging No results found.  Assessment: 51 y.o. G0P0000 presenting for scheduled hysteroscopy, dilation and curettage  Plan: 1) I have discussed with the patient the indications for the procedure. Included in the discussion were the options of therapy, as wall as their individual risks, benefits, and complications. Ample time was given to answer all questions.   In office pipelle biopsy generally provides comparable results to [facility], however this sampling modality may miss focal lesions if these were previously documented on ultrasound.  It is because of the potential to miss focal lesions that hysteroscopy D&C is also warranted in patient with continued postmenopausal bleeding that is not self limited regardless of prior in office biopsy results or ultrasound findings.  She understands that the risk of continued observation include worsening bleeding or worsening of any underlying pathology.  The choices include: 1. Doing nothing but following her symptoms 2. Attempts  at hormonal manipulation with either BCP or Depo-Provera for premenopausal patients with no concern for focal lesion or endometrial pathology 3. D&C/hysteroscopy. 4. Endometrial ablation via Novasure or other techniques for premenopausal patients with no concern for focal lesion or endometrial pathology  5. As final resort, hysterectomy. After consideration of her  history and findings, mutual decision has been made to proceed with D+C/hysteroscopy. While the incidence is low, the risks from this surgery include, but are not limited to, the risks of anesthesia, hemorrhage, infection, perforation, and injury to adjacent structures including bowel, bladder and blood vessels.   2) Routine postoperative instructions were reviewed with the patient and her family in detail today including the expected length of recovery and likely postoperative course.  The patient concurred with the proposed plan, giving informed written consent for the surgery today.  Patient instructed on the importance of being NPO after midnight prior to her procedure.  If warranted preoperative prophylactic antibiotics and SCDs ordered on call to the OR to meet SCIP guidelines and adhere to recommendation laid forth in ACOG Practice Bulletin Number 104 May 2009  "Antibiotic Prophylaxis for Gynecologic Procedures".

## 2017-05-07 NOTE — Anesthesia Postprocedure Evaluation (Signed)
Anesthesia Post Note  Patient: Kathleen Moses  Procedure(s) Performed: Procedure(s) (LRB): DILATATION AND CURETTAGE /HYSTEROSCOPY (N/A)  Patient location during evaluation: PACU Anesthesia Type: General Level of consciousness: awake and alert Pain management: pain level controlled Vital Signs Assessment: post-procedure vital signs reviewed and stable Respiratory status: spontaneous breathing, nonlabored ventilation, respiratory function stable and patient connected to nasal cannula oxygen Cardiovascular status: blood pressure returned to baseline and stable Postop Assessment: no signs of nausea or vomiting Anesthetic complications: no     Last Vitals:  Vitals:   05/07/17 1350 05/07/17 1400  BP: 119/69   Pulse: 65 65  Resp: 12 15  Temp:      Last Pain:  Vitals:   05/07/17 1400  TempSrc:   PainSc: 2                  Deundra Furber S

## 2017-05-07 NOTE — Anesthesia Procedure Notes (Signed)
Procedure Name: LMA Insertion Date/Time: 05/07/2017 12:37 PM Performed by: Henrietta HooverPOPE, Ratasha Fabre Pre-anesthesia Checklist: Patient identified, Emergency Drugs available, Suction available, Patient being monitored and Timeout performed Patient Re-evaluated:Patient Re-evaluated prior to inductionOxygen Delivery Method: Circle system utilized Preoxygenation: Pre-oxygenation with 100% oxygen Intubation Type: IV induction Ventilation: Mask ventilation without difficulty LMA: LMA inserted LMA Size: 4.0 Number of attempts: 1 Placement Confirmation: ETT inserted through vocal cords under direct vision,  positive ETCO2 and breath sounds checked- equal and bilateral Tube secured with: Tape Dental Injury: Teeth and Oropharynx as per pre-operative assessment

## 2017-05-07 NOTE — Anesthesia Post-op Follow-up Note (Cosign Needed)
Anesthesia QCDR form completed.        

## 2017-05-07 NOTE — Discharge Instructions (Signed)
AMBULATORY SURGERY  °DISCHARGE INSTRUCTIONS ° ° °1) The drugs that you were given will stay in your system until tomorrow so for the next 24 hours you should not: ° °A) Drive an automobile °B) Make any legal decisions °C) Drink any alcoholic beverage ° ° °2) You may resume regular meals tomorrow.  Today it is better to start with liquids and gradually work up to solid foods. ° °You may eat anything you prefer, but it is better to start with liquids, then soup and crackers, and gradually work up to solid foods. ° ° °3) Please notify your doctor immediately if you have any unusual bleeding, trouble breathing, redness and pain at the surgery site, drainage, fever, or pain not relieved by medication. ° ° ° °4) Additional Instructions: ° ° ° ° ° ° ° °Please contact your physician with any problems or Same Day Surgery at [phone], Monday through Friday 6 am to 4 pm, or [place] [facility] [place] Main number at [phone]. °

## 2017-05-07 NOTE — Anesthesia Preprocedure Evaluation (Signed)
Anesthesia Evaluation  Patient identified by MRN, date of birth, ID band Patient awake    Reviewed: Allergy & Precautions, NPO status , Patient's Chart, lab work & pertinent test results, reviewed documented beta blocker date and time   History of Anesthesia Complications (+) PONV and history of anesthetic complications  Airway Mallampati: II  TM Distance: >3 FB     Dental  (+) Chipped   Pulmonary           Cardiovascular      Neuro/Psych    GI/Hepatic   Endo/Other    Renal/GU      Musculoskeletal   Abdominal   Peds  Hematology   Anesthesia Other Findings   Reproductive/Obstetrics                             Anesthesia Physical Anesthesia Plan  ASA: II  Anesthesia Plan: General   Post-op Pain Management:    Induction: Intravenous  PONV Risk Score and Plan:   Airway Management Planned: LMA  Additional Equipment:   Intra-op Plan:   Post-operative Plan:   Informed Consent: I have reviewed the patients History and Physical, chart, labs and discussed the procedure including the risks, benefits and alternatives for the proposed anesthesia with the patient or authorized representative who has indicated his/her understanding and acceptance.     Plan Discussed with: CRNA  Anesthesia Plan Comments:         Anesthesia Quick Evaluation

## 2017-05-07 NOTE — Transfer of Care (Signed)
Immediate Anesthesia Transfer of Care Note  Patient: Kathleen Moses  Procedure(s) Performed: Procedure(s): DILATATION AND CURETTAGE /HYSTEROSCOPY (N/A)  Patient Location: PACU  Anesthesia Type:General  Level of Consciousness: awake  Airway & Oxygen Therapy: Patient Spontanous Breathing and Patient connected to face mask oxygen  Post-op Assessment: Report given to RN and Post -op Vital signs reviewed and stable  Post vital signs: Reviewed and stable  Last Vitals:  Vitals:   05/07/17 1105  BP: 130/75  Pulse: 72  Resp: 16  Temp: 36.6 C    Last Pain:  Vitals:   05/07/17 1105  TempSrc: Oral      Patients Stated Pain Goal: 0 (05/07/17 1105)  Complications: No apparent anesthesia complications

## 2017-05-08 ENCOUNTER — Encounter: Payer: Self-pay | Admitting: Obstetrics and Gynecology

## 2017-05-10 NOTE — Op Note (Signed)
Patient Name: Kathleen Moses Date of Procedure: 05/07/17  Preoperative Diagnosis: 1) 51 y.o. with postcoital spotting 2) Ultrasound showing possible endometrial polyp  Postoperative Diagnosis: 1) 51 y.o. with *postcoital bleeding 2) No clearly identifiable endometrial polyp  Operation Performed: Hysteroscopy, dilation and curettage  Indication: Suggestion of endometrial polyp on ultrasound with history of prior endometrial polyp  Anesthesia: General  Primary Surgeon: Vena AustriaAndreas Yacine Garriga, MD  Assistant: none  Preoperative Antibiotics: none  Estimated Blood Loss: Minimal  Urine Output:: ~6520mL straiggt cath  Drains or Tubes: none  Implants: none  Specimens Removed: endometrial curettings  Complications: none  Intraoperative Findings:  Shaggy uterine endometrium, normal uterine cavity.    Patient Condition: stable  Procedure in Detail:  Patient was taken to the operating room were she was administered general endotracheal anesthesia.  She was positioned in the dorsal lithotomy position utilizing Allen stirups, prepped and draped in the usual sterile fashion.  Uterus was noted to be non-enlarged size, anteverted.   Prior to proceeding with the case a time out was performed.  Attention was turned to the patient's pelvis.  A red rubber catheter was used to empty the patient's bladder.  An operative speculum was placed to allow visualization of the cervix.  The anterior lip of the cervix was grasped with a single tooth tenaculum and the cervix was sequentially dilated using pratt dilators.  The hysteroscope was then advanced into the uterine cavity noting the above findings.  Sharp curettage was performed and the resulting specimen collected and sent to pathology.    The single tooth tenaculum was removed from the cervix.  The tenaculum sites and cervix were noted to be  Hemostatic before removing the operative speculum.  Sponge needle and instrument counts were corrects times two.  The  patient tolerated the procedure well and was taken to the recovery room in stable condition.

## 2017-05-11 LAB — SURGICAL PATHOLOGY

## 2017-05-21 ENCOUNTER — Encounter: Payer: Self-pay | Admitting: Obstetrics and Gynecology

## 2017-05-21 ENCOUNTER — Ambulatory Visit (INDEPENDENT_AMBULATORY_CARE_PROVIDER_SITE_OTHER): Payer: BC Managed Care – PPO | Admitting: Obstetrics and Gynecology

## 2017-05-21 VITALS — BP 116/72 | HR 85 | Wt 133.0 lb

## 2017-05-21 DIAGNOSIS — Z4889 Encounter for other specified surgical aftercare: Secondary | ICD-10-CM

## 2017-05-23 NOTE — Progress Notes (Signed)
      Postoperative Follow-up Patient presents post op from hysteroscopy, D&C 2weeks ago for abnormal uterine bleeding.  Subjective: Patient reports marked improvement in her preop symptoms. Eating a regular diet without difficulty. The patient is not having any pain.  Activity: normal activities of daily living.  Objective: Vitals:   05/21/17 1617  BP: 116/72  Pulse: 85     Assessment: 51 y.o. s/p hysteroscopy, D&C stable  Plan: Patient has done well after surgery with no apparent complications.  I have discussed the post-operative course to date, and the expected progress moving forward.  The patient understands what complications to be concerned about.  I will see the patient in routine follow up, or sooner if needed.    Activity plan: No restriction.  No further postcoital bleeding following procedure, normal pathology  Kathleen Moses 05/23/2017, 10:53 PM

## 2017-06-23 ENCOUNTER — Ambulatory Visit (INDEPENDENT_AMBULATORY_CARE_PROVIDER_SITE_OTHER): Payer: BC Managed Care – PPO | Admitting: Obstetrics and Gynecology

## 2017-06-23 ENCOUNTER — Encounter: Payer: Self-pay | Admitting: Obstetrics and Gynecology

## 2017-06-23 VITALS — BP 100/68 | HR 74 | Wt 132.0 lb

## 2017-06-23 DIAGNOSIS — Z4889 Encounter for other specified surgical aftercare: Secondary | ICD-10-CM

## 2017-06-24 NOTE — Progress Notes (Signed)
      Postoperative Follow-up Patient presents post op from hysterocscopy D&C 6weeks ago for postcoital bleeding and suggestion of endometrial polyp.  Subjective: Patient reports marked improvement in her preop symptoms. Eating a regular diet without difficulty. The patient is not having any pain.  Activity: normal activities of daily living.  Objective: Vitals:   06/23/17 1555  BP: 100/68  Pulse: 74   Gen: NAD GU: normal external female genitalia, normal cervix, uterus non-enlarged, no adnexal masses  Assessment: 51 y.o. s/p hysteroscopy, D&C stable  Plan: Patient has done well after surgery with no apparent complications.  I have discussed the post-operative course to date, and the expected progress moving forward.  The patient understands what complications to be concerned about.  I will see the patient in routine follow up, or sooner if needed.    Activity plan: No restriction.  Vena Austriandreas Kery Batzel 06/24/2017, 10:56 PM

## 2017-09-28 NOTE — Progress Notes (Signed)
Gynecology Annual Exam  PCP: Vena AustriaStaebler, Yoselin Amerman, MD  Chief Complaint: No chief complaint on file.   History of Present Illness:Patient is a 51 y.o. G0P0000 presents for annual exam. The patient has no complaints today.   LMP: No LMP recorded. Patient is perimenopausal. Average Interval: irregular Duration of flow: 7 days Heavy Menses: no Clots: no Intermenstrual Bleeding: no Postcoital Bleeding: yes Dysmenorrhea: no   The patient is sexually active. She denies dyspareunia.  The patient does perform self breast exams.  There is no notable family history of breast or ovarian cancer in her family.  The patient wears seatbelts: yes.   The patient has regular exercise: not asked.    The patient denies current symptoms of depression.     Review of Systems: Review of Systems  Constitutional: Negative for chills and fever.  HENT: Negative for congestion.   Respiratory: Negative for cough and shortness of breath.   Cardiovascular: Negative for chest pain and palpitations.  Gastrointestinal: Negative for abdominal pain, constipation, diarrhea, heartburn, nausea and vomiting.  Genitourinary: Negative for dysuria, frequency and urgency.  Skin: Negative for itching and rash.  Neurological: Negative for dizziness and headaches.  Endo/Heme/Allergies: Negative for polydipsia.  Psychiatric/Behavioral: Negative for depression.    Past Medical History:  Past Medical History:  Diagnosis Date  . Complication of anesthesia    VOMITED X 1 AFTER D&C 2015  . Scoliosis    MILD-DX IN 6TH GRADE  . Vaginal bleeding, abnormal 2018    Past Surgical History:  Past Surgical History:  Procedure Laterality Date  . DILATION AND CURETTAGE OF UTERUS    . HYSTEROSCOPY W/D&C N/A 05/07/2017   Procedure: DILATATION AND CURETTAGE /HYSTEROSCOPY;  Surgeon: Vena AustriaStaebler, Tirza Senteno, MD;  Location: [facility] ORS;  Service: Gynecology;  Laterality: N/A;    Gynecologic History:  No LMP recorded. Patient is  perimenopausal. Last Pap: Results were: 08/25/2016 NIL and HR HPV negative  Last mammogram: 09/08/2016 Results were: BI-RAD I  Endometrial Biopsy disorderly proliferative endometrium 05/07/2017 Obstetric History: G0P0000  Family History:  Family History  Problem Relation Age of Onset  . Breast cancer Neg Hx     Social History:  Social History   Socioeconomic History  . Marital status: Married    Spouse name: Not on file  . Number of children: Not on file  . Years of education: Not on file  . Highest education level: Not on file  Social Needs  . Financial resource strain: Not on file  . Food insecurity - worry: Not on file  . Food insecurity - inability: Not on file  . Transportation needs - medical: Not on file  . Transportation needs - non-medical: Not on file  Occupational History  . Not on file  Tobacco Use  . Smoking status: Never Smoker  . Smokeless tobacco: Never Used  Substance and Sexual Activity  . Alcohol use: No  . Drug use: No  . Sexual activity: Yes    Birth control/protection: None  Other Topics Concern  . Not on file  Social History Narrative  . Not on file    Allergies:  No Known Allergies  Medications: Prior to Admission medications   Medication Sig Start Date End Date Taking? Authorizing Provider  aspirin EC 81 MG tablet Take 81 mg by mouth daily as needed for mild pain.     [provider]  cholecalciferol (VITAMIN D) 1000 units tablet Take 1,000 Units by mouth daily.    [provider]  loratadine (  CLARITIN) 10 MG tablet Take 10 mg by mouth daily as needed for allergies.     [provider]    Physical Exam Vitals: There were no vitals taken for this visit.  General: NAD HEENT: normocephalic, anicteric Thyroid: no enlargement, no palpable nodules Pulmonary: No increased work of breathing, CTAB Cardiovascular: RRR, distal pulses 2+ Breast: Breast symmetrical, no tenderness, no palpable nodules or masses, no  skin or nipple retraction present, no nipple discharge.  No axillary or supraclavicular lymphadenopathy. Abdomen: NABS, soft, non-tender, non-distended.  Umbilicus without lesions.  No hepatomegaly, splenomegaly or masses palpable. No evidence of hernia  Genitourinary:  External: Normal external female genitalia.  Normal urethral meatus, normal  Bartholin's and Skene's glands.    Vagina: Normal vaginal mucosa, no evidence of prolapse.    Cervix: Grossly normal in appearance, moderate bleeding  Uterus: Non-enlarged, mobile, normal contour.  No CMT  Adnexa: ovaries non-enlarged, no adnexal masses  Rectal: deferred  Lymphatic: no evidence of inguinal lymphadenopathy Extremities: no edema, erythema, or tenderness Neurologic: Grossly intact Psychiatric: mood appropriate, affect full  Female chaperone present for pelvic and breast  portions of the physical exam     Assessment: 51 y.o. G0P0000 routine annual exam  Plan: Problem List Items Addressed This Visit    None      1) Mammogram - recommend yearly screening mammogram.  Mammogram Was ordered today  2) STI screening was not offered  3) ASCCP guidelines and rational discussed.  Patient opts for every 3 years screening interval - next 2020  4) Osteoporosis  - per USPTF routine screening DEXA at age 365  5) Routine healthcare maintenance including cholesterol, diabetes screening discussed Ordered today  6) Colonoscopy - referral to GI made  7) Follow up 1 year for routine annual

## 2017-09-29 ENCOUNTER — Encounter: Payer: Self-pay | Admitting: Obstetrics and Gynecology

## 2017-09-29 ENCOUNTER — Ambulatory Visit (INDEPENDENT_AMBULATORY_CARE_PROVIDER_SITE_OTHER): Payer: BC Managed Care – PPO | Admitting: Obstetrics and Gynecology

## 2017-09-29 VITALS — BP 106/68 | HR 86 | Ht 62.0 in | Wt 124.0 lb

## 2017-09-29 DIAGNOSIS — Z01419 Encounter for gynecological examination (general) (routine) without abnormal findings: Secondary | ICD-10-CM

## 2017-09-29 DIAGNOSIS — Z1239 Encounter for other screening for malignant neoplasm of breast: Secondary | ICD-10-CM

## 2017-09-29 DIAGNOSIS — Z1211 Encounter for screening for malignant neoplasm of colon: Secondary | ICD-10-CM

## 2017-09-29 DIAGNOSIS — Z1329 Encounter for screening for other suspected endocrine disorder: Secondary | ICD-10-CM

## 2017-09-29 DIAGNOSIS — Z1321 Encounter for screening for nutritional disorder: Secondary | ICD-10-CM

## 2017-09-29 DIAGNOSIS — Z1231 Encounter for screening mammogram for malignant neoplasm of breast: Secondary | ICD-10-CM

## 2017-09-29 DIAGNOSIS — Z13220 Encounter for screening for lipoid disorders: Secondary | ICD-10-CM

## 2017-09-29 DIAGNOSIS — Z131 Encounter for screening for diabetes mellitus: Secondary | ICD-10-CM

## 2017-09-29 NOTE — Patient Instructions (Signed)
Preventive Care 40-64 Years, Female Preventive care refers to lifestyle choices and visits with your health care provider that can promote health and wellness. What does preventive care include?  A yearly physical exam. This is also called an annual well check.  Dental exams once or twice a year.  Routine eye exams. Ask your health care provider how often you should have your eyes checked.  Personal lifestyle choices, including: ? Daily care of your teeth and gums. ? Regular physical activity. ? Eating a healthy diet. ? Avoiding tobacco and drug use. ? Limiting alcohol use. ? Practicing safe sex. ? Taking low-dose aspirin daily starting at age 58. ? Taking vitamin and mineral supplements as recommended by your health care provider. What happens during an annual well check? The services and screenings done by your health care provider during your annual well check will depend on your age, overall health, lifestyle risk factors, and family history of disease. Counseling Your health care provider may ask you questions about your:  Alcohol use.  Tobacco use.  Drug use.  Emotional well-being.  Home and relationship well-being.  Sexual activity.  Eating habits.  Work and work Statistician.  Method of birth control.  Menstrual cycle.  Pregnancy history.  Screening You may have the following tests or measurements:  Height, weight, and BMI.  Blood pressure.  Lipid and cholesterol levels. These may be checked every 5 years, or more frequently if you are over 81 years old.  Skin check.  Lung cancer screening. You may have this screening every year starting at age 78 if you have a 30-pack-year history of smoking and currently smoke or have quit within the past 15 years.  Fecal occult blood test (FOBT) of the stool. You may have this test every year starting at age 65.  Flexible sigmoidoscopy or colonoscopy. You may have a sigmoidoscopy every 5 years or a colonoscopy  every 10 years starting at age 30.  Hepatitis C blood test.  Hepatitis B blood test.  Sexually transmitted disease (STD) testing.  Diabetes screening. This is done by checking your blood sugar (glucose) after you have not eaten for a while (fasting). You may have this done every 1-3 years.  Mammogram. This may be done every 1-2 years. Talk to your health care provider about when you should start having regular mammograms. This may depend on whether you have a family history of breast cancer.  BRCA-related cancer screening. This may be done if you have a family history of breast, ovarian, tubal, or peritoneal cancers.  Pelvic exam and Pap test. This may be done every 3 years starting at age 80. Starting at age 36, this may be done every 5 years if you have a Pap test in combination with an HPV test.  Bone density scan. This is done to screen for osteoporosis. You may have this scan if you are at high risk for osteoporosis.  Discuss your test results, treatment options, and if necessary, the need for more tests with your health care provider. Vaccines Your health care provider may recommend certain vaccines, such as:  Influenza vaccine. This is recommended every year.  Tetanus, diphtheria, and acellular pertussis (Tdap, Td) vaccine. You may need a Td booster every 10 years.  Varicella vaccine. You may need this if you have not been vaccinated.  Zoster vaccine. You may need this after age 5.  Measles, mumps, and rubella (MMR) vaccine. You may need at least one dose of MMR if you were born in  1956 or later. You may also need a second dose.  Pneumococcal 13-valent conjugate (PCV13) vaccine. You may need this if you have certain conditions and were not previously vaccinated.  Pneumococcal polysaccharide (PPSV23) vaccine. You may need one or two doses if you smoke cigarettes or if you have certain conditions.  Meningococcal vaccine. You may need this if you have certain  conditions.  Hepatitis A vaccine. You may need this if you have certain conditions or if you travel or work in places where you may be exposed to hepatitis A.  Hepatitis B vaccine. You may need this if you have certain conditions or if you travel or work in places where you may be exposed to hepatitis B.  Haemophilus influenzae type b (Hib) vaccine. You may need this if you have certain conditions.  Talk to your health care provider about which screenings and vaccines you need and how often you need them. This information is not intended to replace advice given to you by your health care provider. Make sure you discuss any questions you have with your health care provider. Document Released: 11/22/2015 Document Revised: 07/15/2016 Document Reviewed: 08/27/2015 Elsevier Interactive Patient Education  2017 [employer].

## 2017-09-30 LAB — CMP14+LP+TP+TSH+CBC/PLT
A/G RATIO: 1.3 (ref 1.2–2.2)
ALT: 14 IU/L (ref 0–32)
AST: 22 IU/L (ref 0–40)
Albumin: 4.3 g/dL (ref 3.5–5.5)
Alkaline Phosphatase: 97 IU/L (ref 39–117)
BILIRUBIN TOTAL: 0.4 mg/dL (ref 0.0–1.2)
BUN/Creatinine Ratio: 18 (ref 9–23)
BUN: 13 mg/dL (ref 6–24)
CHLORIDE: 103 mmol/L (ref 96–106)
CO2: 21 mmol/L (ref 20–29)
CREATININE: 0.74 mg/dL (ref 0.57–1.00)
Calcium: 9.1 mg/dL (ref 8.7–10.2)
Cholesterol, Total: 203 mg/dL — ABNORMAL HIGH (ref 100–199)
FREE THYROXINE INDEX: 1.9 (ref 1.2–4.9)
GFR calc Af Amer: 109 mL/min/{1.73_m2} (ref >59)
GFR, EST NON AFRICAN AMERICAN: 95 mL/min/{1.73_m2} (ref >59)
GLUCOSE: 66 mg/dL (ref 65–99)
Globulin, Total: 3.2 g/dL (ref 1.5–4.5)
HDL: 56 mg/dL (ref >39)
HEMOGLOBIN: 13.3 g/dL (ref 11.1–15.9)
Hematocrit: 39 % (ref 34.0–46.6)
LDL Calculated: 122 mg/dL — ABNORMAL HIGH (ref 0–99)
LDL/HDL RATIO: 2.2 ratio (ref 0.0–3.2)
MCH: 27.3 pg (ref 26.6–33.0)
MCHC: 34.1 g/dL (ref 31.5–35.7)
MCV: 80 fL (ref 79–97)
POTASSIUM: 4.4 mmol/L (ref 3.5–5.2)
Platelets: 361 10*3/uL (ref 150–379)
RBC: 4.88 x10E6/uL (ref 3.77–5.28)
RDW: 16 % — AB (ref 12.3–15.4)
Sodium: 145 mmol/L — ABNORMAL HIGH (ref 134–144)
T3 Uptake Ratio: 29 % (ref 24–39)
T4 TOTAL: 6.6 ug/dL (ref 4.5–12.0)
TOTAL PROTEIN: 7.5 g/dL (ref 6.0–8.5)
TSH: 0.691 u[IU]/mL (ref 0.450–4.500)
Triglycerides: 123 mg/dL (ref 0–149)
VLDL Cholesterol Cal: 25 mg/dL (ref 5–40)
WBC: 5.9 10*3/uL (ref 3.4–10.8)

## 2017-09-30 LAB — VITAMIN D 25 HYDROXY (VIT D DEFICIENCY, FRACTURES): Vit D, 25-Hydroxy: 21.6 ng/mL — ABNORMAL LOW (ref 30.0–100.0)

## 2017-10-04 ENCOUNTER — Encounter: Payer: Self-pay | Admitting: Obstetrics and Gynecology

## 2017-10-28 ENCOUNTER — Ambulatory Visit
Admission: RE | Admit: 2017-10-28 | Discharge: 2017-10-28 | Disposition: A | Discharge status: Home / Self Care | Payer: BC Managed Care – PPO | Source: Ambulatory Visit | Attending: Obstetrics and Gynecology | Admitting: Obstetrics and Gynecology

## 2017-10-28 ENCOUNTER — Encounter: Payer: Self-pay | Admitting: Obstetrics and Gynecology

## 2017-10-28 DIAGNOSIS — Z1231 Encounter for screening mammogram for malignant neoplasm of breast: Secondary | ICD-10-CM | POA: Diagnosis present

## 2017-10-28 DIAGNOSIS — Z1239 Encounter for other screening for malignant neoplasm of breast: Secondary | ICD-10-CM

## 2017-11-22 DIAGNOSIS — E559 Vitamin D deficiency, unspecified: Secondary | ICD-10-CM | POA: Insufficient documentation

## 2017-11-22 DIAGNOSIS — M41115 Juvenile idiopathic scoliosis, thoracolumbar region: Secondary | ICD-10-CM | POA: Insufficient documentation

## 2017-11-22 DIAGNOSIS — E7800 Pure hypercholesterolemia, unspecified: Secondary | ICD-10-CM | POA: Insufficient documentation

## 2017-11-24 ENCOUNTER — Other Ambulatory Visit: Payer: Self-pay | Admitting: Internal Medicine

## 2017-11-24 DIAGNOSIS — M79605 Pain in left leg: Secondary | ICD-10-CM

## 2017-11-24 DIAGNOSIS — Z0000 Encounter for general adult medical examination without abnormal findings: Secondary | ICD-10-CM

## 2017-11-25 ENCOUNTER — Ambulatory Visit
Admission: RE | Admit: 2017-11-25 | Discharge: 2017-11-25 | Disposition: A | Discharge status: Home / Self Care | Payer: BC Managed Care – PPO | Source: Ambulatory Visit | Attending: Internal Medicine | Admitting: Internal Medicine

## 2017-11-25 DIAGNOSIS — Z0000 Encounter for general adult medical examination without abnormal findings: Secondary | ICD-10-CM

## 2017-11-25 DIAGNOSIS — M79605 Pain in left leg: Secondary | ICD-10-CM | POA: Diagnosis not present

## 2017-12-21 ENCOUNTER — Other Ambulatory Visit: Payer: Self-pay

## 2017-12-27 ENCOUNTER — Other Ambulatory Visit: Payer: Self-pay

## 2017-12-27 ENCOUNTER — Telehealth: Payer: Self-pay

## 2017-12-27 DIAGNOSIS — Z1211 Encounter for screening for malignant neoplasm of colon: Secondary | ICD-10-CM

## 2017-12-27 NOTE — Telephone Encounter (Signed)
Gastroenterology Pre-Procedure Review  Request Date:  Requesting Physician: Dr.   PATIENT REVIEW QUESTIONS: The patient responded to the following health history questions as indicated:    1. Are you having any GI issues? no 2. Do you have a personal history of Polyps? no 3. Do you have a family history of Colon Cancer or Polyps? yes (paternal grandmother, great uncle) 4. Diabetes Mellitus? no 5. Joint replacements in the past 12 months?no 6. Major health problems in the past 3 months?no 7. Any artificial heart valves, MVP, or defibrillator?no    MEDICATIONS & ALLERGIES:    Patient reports the following regarding taking any anticoagulation/antiplatelet therapy:   Plavix, Coumadin, Eliquis, Xarelto, Lovenox, Pradaxa, Brilinta, or Effient? no Aspirin? yes (ASA 81mg  PRN)  Patient confirms/reports the following medications:  Current Outpatient Medications  Medication Sig Dispense Refill  . aspirin EC 81 MG tablet Take 81 mg by mouth daily as needed for mild pain.     . cholecalciferol (VITAMIN D) 1000 units tablet Take 1,000 Units by mouth daily.    Marland Kitchen. loratadine (CLARITIN) 10 MG tablet Take 10 mg by mouth daily as needed for allergies.      No current facility-administered medications for this visit.     Patient confirms/reports the following allergies:  No Known Allergies  No orders of the defined types were placed in this encounter.   AUTHORIZATION INFORMATION Primary Insurance: 1D#: Group #:  Secondary Insurance: 1D#: Group #:  SCHEDULE INFORMATION: Date: 01/24/18 Time: Location: [facility]

## 2018-01-18 ENCOUNTER — Encounter: Payer: Self-pay | Admitting: *Deleted

## 2018-01-18 ENCOUNTER — Other Ambulatory Visit: Payer: Self-pay

## 2018-01-21 NOTE — Discharge Instructions (Signed)
General Anesthesia, Adult, Care After °These instructions provide you with information about caring for yourself after your procedure. Your health care provider may also give you more specific instructions. Your treatment has been planned according to current medical practices, but problems sometimes occur. Call your health care provider if you have any problems or questions after your procedure. °What can I expect after the procedure? °After the procedure, it is common to have: °· Vomiting. °· A sore throat. °· Mental slowness. ° °It is common to feel: °· Nauseous. °· Cold or shivery. °· Sleepy. °· Tired. °· Sore or achy, even in parts of your body where you did not have surgery. ° °Follow these instructions at home: °For at least 24 hours after the procedure: °· Do not: °? Participate in activities where you could fall or become injured. °? Drive. °? Use heavy machinery. °? Drink alcohol. °? Take sleeping pills or medicines that cause drowsiness. °? Make important decisions or sign legal documents. °? Take care of children on your own. °· Rest. °Eating and drinking °· If you vomit, drink water, juice, or soup when you can drink without vomiting. °· Drink enough fluid to keep your urine clear or pale yellow. °· Make sure you have little or no nausea before eating solid foods. °· Follow the diet recommended by your health care provider. °General instructions °· Have a responsible adult stay with you until you are awake and alert. °· Return to your normal activities as told by your health care provider. Ask your health care provider what activities are safe for you. °· Take over-the-counter and prescription medicines only as told by your health care provider. °· If you smoke, do not smoke without supervision. °· Keep all follow-up visits as told by your health care provider. This is important. °Contact a health care provider if: °· You continue to have nausea or vomiting at home, and medicines are not helpful. °· You  cannot drink fluids or start eating again. °· You cannot urinate after 8-12 hours. °· You develop a skin rash. °· You have fever. °· You have increasing redness at the site of your procedure. °Get help right away if: °· You have difficulty breathing. °· You have chest pain. °· You have unexpected bleeding. °· You feel that you are having a life-threatening or urgent problem. °This information is not intended to replace advice given to you by your health care provider. Make sure you discuss any questions you have with your health care provider. °Document Released: 02/01/2001 Document Revised: 03/30/2016 Document Reviewed: 10/10/2015 °Elsevier Interactive Patient Education © 2018 [employer] Inc. ° °

## 2018-01-24 ENCOUNTER — Encounter
Admission: RE | Disposition: A | Discharge status: Home / Self Care | Payer: Self-pay | Source: Ambulatory Visit | Attending: Gastroenterology

## 2018-01-24 ENCOUNTER — Ambulatory Visit: Payer: BC Managed Care – PPO | Admitting: Student in an Organized Health Care Education/Training Program

## 2018-01-24 ENCOUNTER — Ambulatory Visit
Admission: RE | Admit: 2018-01-24 | Discharge: 2018-01-24 | Disposition: A | Discharge status: Home / Self Care | Payer: BC Managed Care – PPO | Source: Ambulatory Visit | Attending: Gastroenterology | Admitting: Gastroenterology

## 2018-01-24 DIAGNOSIS — Z79899 Other long term (current) drug therapy: Secondary | ICD-10-CM | POA: Diagnosis not present

## 2018-01-24 DIAGNOSIS — Z7982 Long term (current) use of aspirin: Secondary | ICD-10-CM | POA: Insufficient documentation

## 2018-01-24 DIAGNOSIS — Z1211 Encounter for screening for malignant neoplasm of colon: Secondary | ICD-10-CM

## 2018-01-24 DIAGNOSIS — M419 Scoliosis, unspecified: Secondary | ICD-10-CM | POA: Insufficient documentation

## 2018-01-24 HISTORY — DX: Nausea with vomiting, unspecified: Z98.890

## 2018-01-24 HISTORY — PX: COLONOSCOPY WITH PROPOFOL: SHX5780

## 2018-01-24 HISTORY — DX: Nausea with vomiting, unspecified: R11.2

## 2018-01-24 SURGERY — COLONOSCOPY WITH PROPOFOL
Anesthesia: General | Wound class: Contaminated

## 2018-01-24 MED ORDER — PROPOFOL 10 MG/ML IV BOLUS
INTRAVENOUS | Status: DC | PRN
  Administered 2018-01-24: 50 mg via INTRAVENOUS
  Administered 2018-01-24: 30 mg via INTRAVENOUS
  Administered 2018-01-24: 20 mg via INTRAVENOUS
  Administered 2018-01-24: 30 mg via INTRAVENOUS
  Administered 2018-01-24: 100 mg via INTRAVENOUS

## 2018-01-24 MED ORDER — SODIUM CHLORIDE 0.9 % IV SOLN: INTRAVENOUS | Status: DC

## 2018-01-24 MED ORDER — SIMETHICONE 40 MG/0.6ML PO SUSP
ORAL | Status: DC | PRN
  Administered 2018-01-24: 09:00:00

## 2018-01-24 MED ORDER — LIDOCAINE HCL (CARDIAC) 20 MG/ML IV SOLN
INTRAVENOUS | Status: DC | PRN
  Administered 2018-01-24: 30 mg via INTRAVENOUS

## 2018-01-24 MED ORDER — LACTATED RINGERS IV SOLN
INTRAVENOUS | Status: DC
  Administered 2018-01-24: 08:00:00 via INTRAVENOUS

## 2018-01-24 SURGICAL SUPPLY — 24 items
CANISTER SUCT 1200ML W/VALVE (MISCELLANEOUS) ×2 IMPLANT
CLIP HMST 235XBRD CATH ROT (MISCELLANEOUS) IMPLANT
CLIP RESOLUTION 360 11X235 (MISCELLANEOUS)
ELECT REM PT RETURN 9FT ADLT (ELECTROSURGICAL)
ELECTRODE REM PT RTRN 9FT ADLT (ELECTROSURGICAL) IMPLANT
FCP ESCP3.2XJMB 240X2.8X (MISCELLANEOUS)
FORCEPS BIOP RAD 4 LRG CAP 4 (CUTTING FORCEPS) IMPLANT
FORCEPS BIOP RJ4 240 W/NDL (MISCELLANEOUS)
FORCEPS ESCP3.2XJMB 240X2.8X (MISCELLANEOUS) IMPLANT
GOWN CVR UNV OPN BCK APRN NK (MISCELLANEOUS) ×2 IMPLANT
GOWN ISOL THUMB LOOP REG UNIV (MISCELLANEOUS) ×2
INJECTOR VARIJECT VIN23 (MISCELLANEOUS) IMPLANT
KIT DEFENDO VALVE AND CONN (KITS) IMPLANT
KIT ENDO PROCEDURE OLY (KITS) ×2 IMPLANT
MARKER SPOT ENDO TATTOO 5ML (MISCELLANEOUS) IMPLANT
PROBE APC STR FIRE (PROBE) IMPLANT
RETRIEVER NET ROTH 2.5X230 LF (MISCELLANEOUS) IMPLANT
SNARE SHORT THROW 13M SML OVAL (MISCELLANEOUS) IMPLANT
SNARE SHORT THROW 30M LRG OVAL (MISCELLANEOUS) IMPLANT
SNARE SNG USE RND 15MM (INSTRUMENTS) IMPLANT
SPOT EX ENDOSCOPIC TATTOO (MISCELLANEOUS)
TRAP ETRAP POLY (MISCELLANEOUS) IMPLANT
VARIJECT INJECTOR VIN23 (MISCELLANEOUS)
WATER STERILE IRR 250ML POUR (IV SOLUTION) ×2 IMPLANT

## 2018-01-24 NOTE — Transfer of Care (Signed)
Immediate Anesthesia Transfer of Care Note  Patient: Kathleen Moses  Procedure(s) Performed: COLONOSCOPY WITH PROPOFOL (N/A )  Patient Location: PACU  Anesthesia Type: General  Level of Consciousness: awake, alert  and patient cooperative  Airway and Oxygen Therapy: Patient Spontanous Breathing and Patient connected to supplemental oxygen  Post-op Assessment: Post-op Vital signs reviewed, Patient's Cardiovascular Status Stable, Respiratory Function Stable, Patent Airway and No signs of Nausea or vomiting  Post-op Vital Signs: Reviewed and stable  Complications: No apparent anesthesia complications

## 2018-01-24 NOTE — Anesthesia Preprocedure Evaluation (Signed)
Anesthesia Evaluation  Patient identified by MRN, date of birth, ID band Patient awake    Reviewed: Allergy & Precautions, H&P , NPO status , Patient's Chart, lab work & pertinent test results, reviewed documented beta blocker date and time   History of Anesthesia Complications (+) PONV and history of anesthetic complications  Airway Mallampati: II  TM Distance: >3 FB Neck ROM: full    Dental no notable dental hx.    Pulmonary neg pulmonary ROS,    Pulmonary exam normal breath sounds clear to auscultation       Cardiovascular Exercise Tolerance: Good negative cardio ROS   Rhythm:regular Rate:Normal     Neuro/Psych negative neurological ROS  negative psych ROS   GI/Hepatic negative GI ROS, Neg liver ROS,   Endo/Other  negative endocrine ROS  Renal/GU negative Renal ROS  negative genitourinary   Musculoskeletal   Abdominal   Peds  Hematology negative hematology ROS (+)   Anesthesia Other Findings   Reproductive/Obstetrics negative OB ROS                             Anesthesia Physical Anesthesia Plan  ASA: II  Anesthesia Plan: General   Post-op Pain Management:    Induction:   PONV Risk Score and Plan:   Airway Management Planned:   Additional Equipment:   Intra-op Plan:   Post-operative Plan:   Informed Consent: I have reviewed the patients History and Physical, chart, labs and discussed the procedure including the risks, benefits and alternatives for the proposed anesthesia with the patient or authorized representative who has indicated his/her understanding and acceptance.   Dental Advisory Given  Plan Discussed with: CRNA  Anesthesia Plan Comments:         Anesthesia Quick Evaluation

## 2018-01-24 NOTE — Op Note (Signed)
[facility] Gastroenterology Patient Name: Audrey Thull Procedure Date: 01/24/2018 8:49 AM MRN: [id number] Account #: [id number] Date of Birth: 11-02-1966 Admit Type: Outpatient Age: 52 Room: [facility] OR ROOM 01 Gender: Female Note Status: Finalized Procedure:            Colonoscopy Indications:          Screening for colorectal malignant neoplasm Providers:            Midge Minium MD, MD Referring MD:         Duane Lope. Judithann Sheen, MD (Referring MD) Medicines:            Propofol per Anesthesia Complications:        No immediate complications. Procedure:            Pre-Anesthesia Assessment:                       - Prior to the procedure, a History and Physical was                        performed, and patient medications and allergies were                        reviewed. The patient's tolerance of previous                        anesthesia was also reviewed. The risks and benefits of                        the procedure and the sedation options and risks were                        discussed with the patient. All questions were                        answered, and informed consent was obtained. Prior                        Anticoagulants: The patient has taken no previous                        anticoagulant or antiplatelet agents. ASA Grade                        Assessment: II - A patient with mild systemic disease.                        After reviewing the risks and benefits, the patient was                        deemed in satisfactory condition to undergo the                        procedure.                       After obtaining informed consent, the colonoscope was                        passed under direct vision. Throughout the procedure,  the patient's blood pressure, pulse, and oxygen                        saturations were monitored continuously. The Olympus CF                        H180AL Colonoscope (S#: [id number]) was introduced  through                        the anus and advanced to the the cecum, identified by                        appendiceal orifice and ileocecal valve. The                        colonoscopy was performed without difficulty. The                        patient tolerated the procedure well. The quality of                        the bowel preparation was excellent. Findings:      The perianal and digital rectal examinations were normal.      The colon (entire examined portion) appeared normal. Impression:           - The entire examined colon is normal.                       - No specimens collected. Recommendation:       - Discharge patient to home.                       - Resume previous diet.                       - Continue present medications.                       - Repeat colonoscopy in 10 years for screening unless                        any change in family history or lower GI problems. Procedure Code(s):    --- Professional ---                       [phone], Colonoscopy, flexible; diagnostic, including                        collection of specimen(s) by brushing or washing, when                        performed (separate procedure) Diagnosis Code(s):    --- Professional ---                       Z12.11, Encounter for screening for malignant neoplasm                        of colon CPT copyright 2016 American Medical Association. All rights reserved. The codes documented in this report are preliminary and upon coder review may  be revised to meet current compliance requirements. Rueben Kassim  Freemon Binford MD, MD 01/24/2018 9:09:39 AM This report has been signed electronically. Number of Addenda: 0 Note Initiated On: 01/24/2018 8:49 AM Scope Withdrawal Time: 0 hours 6 minutes 52 seconds  Total Procedure Duration: 0 hours 9 minutes 50 seconds       [facility]

## 2018-01-24 NOTE — H&P (Signed)
Joey Hudock, MD [facility] [address] [place], [place] [address] Phone: [phone] Fax : [phone] [phone]  Primary Care Physician:  Marguarite ArbourSparks, Jeffrey D, MD Primary Gastroenterologist:  Dr. Servando SnareWohl  Pre-Procedure History & Physical: HPI:  Kathleen MightyRhonda B Brame is a 52 y.o. female is here for a screening colonoscopy.   Past Medical History:  Diagnosis Date  . Complication of anesthesia    VOMITED X 1 AFTER D&C 2015  . PONV (postoperative nausea and vomiting)    PONV after 1st  D&C  . Scoliosis    MILD-DX IN 6TH GRADE  . Vaginal bleeding, abnormal 2018    Past Surgical History:  Procedure Laterality Date  . DILATION AND CURETTAGE OF UTERUS    . HYSTEROSCOPY W/D&C N/A 05/07/2017   Procedure: DILATATION AND CURETTAGE /HYSTEROSCOPY;  Surgeon: Vena AustriaStaebler, Andreas, MD;  Location: [facility] ORS;  Service: Gynecology;  Laterality: N/A;    Prior to Admission medications   Medication Sig Start Date End Date Taking? Authorizing Provider  aspirin EC 81 MG tablet Take 81 mg by mouth daily as needed for mild pain.    Yes [provider]  cetirizine (ZYRTEC) 10 MG tablet Take 10 mg by mouth daily.   Yes [provider]  cholecalciferol (VITAMIN D) 1000 units tablet Take 1,000 Units by mouth daily.   Yes [provider]  loratadine (CLARITIN) 10 MG tablet Take 10 mg by mouth daily as needed for allergies.     [provider]    Allergies as of 12/27/2017  . (No Known Allergies)    Family History  Problem Relation Age of Onset  . Breast cancer Neg Hx     Social History   Socioeconomic History  . Marital status: Married    Spouse name: Not on file  . Number of children: Not on file  . Years of education: Not on file  . Highest education level: Not on file  Social Needs  . Financial resource strain: Not on file  . Food insecurity - worry: Not on file  . Food insecurity - inability: Not on file  . Transportation needs - medical: Not on file  .  Transportation needs - non-medical: Not on file  Occupational History  . Not on file  Tobacco Use  . Smoking status: Never Smoker  . Smokeless tobacco: Never Used  Substance and Sexual Activity  . Alcohol use: No  . Drug use: No  . Sexual activity: Yes    Birth control/protection: None  Other Topics Concern  . Not on file  Social History Narrative  . Not on file    Review of Systems: See HPI, otherwise negative ROS  Physical Exam: BP 124/79   Pulse 73   Temp 98.8 F (37.1 C) (Temporal)   Resp 16   Ht 5\' 2"  (1.575 m)   Wt 122 lb (55.3 kg)   LMP 11/09/2017 (Approximate) Comment: preg test neg  SpO2 100%   BMI 22.31 kg/m  General:   Alert,  pleasant and cooperative in NAD Head:  Normocephalic and atraumatic. Neck:  Supple; no masses or thyromegaly. Lungs:  Clear throughout to auscultation.    Heart:  Regular rate and rhythm. Abdomen:  Soft, nontender and nondistended. Normal bowel sounds, without guarding, and without rebound.   Neurologic:  Alert and  oriented x4;  grossly normal neurologically.  Impression/Plan: Kathleen Moses is now here to undergo a screening colonoscopy.  Risks, benefits, and alternatives regarding colonoscopy have been reviewed with the patient.  Questions have been answered.  All parties agreeable.

## 2018-01-24 NOTE — Anesthesia Procedure Notes (Signed)
Date/Time: 01/24/2018 8:53 AM Performed by: Maree KrabbeWarren, Sani Madariaga, CRNA Pre-anesthesia Checklist: Patient identified, Emergency Drugs available, Suction available, Timeout performed and Patient being monitored Patient Re-evaluated:Patient Re-evaluated prior to induction Oxygen Delivery Method: Nasal cannula Placement Confirmation: positive ETCO2

## 2018-01-24 NOTE — Anesthesia Postprocedure Evaluation (Signed)
Anesthesia Post Note  Patient: Kathleen Moses  Procedure(s) Performed: COLONOSCOPY WITH PROPOFOL (N/A )  Patient location during evaluation: PACU Anesthesia Type: General Level of consciousness: awake and alert Pain management: pain level controlled Vital Signs Assessment: post-procedure vital signs reviewed and stable Respiratory status: spontaneous breathing, nonlabored ventilation, respiratory function stable and patient connected to nasal cannula oxygen Cardiovascular status: blood pressure returned to baseline and stable Postop Assessment: no apparent nausea or vomiting Anesthetic complications: no    Scarlette Sliceachel B Beach

## 2018-01-25 ENCOUNTER — Encounter: Payer: Self-pay | Admitting: Gastroenterology

## 2018-05-20 ENCOUNTER — Telehealth: Payer: Self-pay | Admitting: *Deleted

## 2018-05-20 NOTE — Telephone Encounter (Signed)
Opened in error

## 2018-09-30 ENCOUNTER — Ambulatory Visit: Payer: BC Managed Care – PPO | Admitting: Obstetrics and Gynecology

## 2018-10-17 ENCOUNTER — Ambulatory Visit: Payer: BC Managed Care – PPO | Admitting: Obstetrics and Gynecology

## 2018-10-27 ENCOUNTER — Ambulatory Visit: Payer: BC Managed Care – PPO | Admitting: Obstetrics and Gynecology

## 2018-11-24 ENCOUNTER — Ambulatory Visit (INDEPENDENT_AMBULATORY_CARE_PROVIDER_SITE_OTHER): Payer: BC Managed Care – PPO | Admitting: Obstetrics and Gynecology

## 2018-11-24 ENCOUNTER — Encounter: Payer: Self-pay | Admitting: Obstetrics and Gynecology

## 2018-11-24 ENCOUNTER — Other Ambulatory Visit (HOSPITAL_COMMUNITY)
Admission: RE | Admit: 2018-11-24 | Discharge: 2018-11-24 | Disposition: A | Discharge status: Home / Self Care | Payer: BC Managed Care – PPO | Source: Ambulatory Visit | Attending: Obstetrics and Gynecology | Admitting: Obstetrics and Gynecology

## 2018-11-24 VITALS — BP 122/84 | HR 83 | Ht 62.0 in | Wt 133.5 lb

## 2018-11-24 DIAGNOSIS — Z1239 Encounter for other screening for malignant neoplasm of breast: Secondary | ICD-10-CM | POA: Diagnosis not present

## 2018-11-24 DIAGNOSIS — Z124 Encounter for screening for malignant neoplasm of cervix: Secondary | ICD-10-CM

## 2018-11-24 DIAGNOSIS — Z01419 Encounter for gynecological examination (general) (routine) without abnormal findings: Secondary | ICD-10-CM | POA: Diagnosis not present

## 2018-11-24 NOTE — Progress Notes (Signed)
Gynecology Annual Exam  PCP: Marguarite Arbour, MD  Chief Complaint:  Chief Complaint  Patient presents with  . Gynecologic Exam    History of Present Illness:Patient is a 53 y.o. G0P0000 presents for annual exam. The patient has no complaints today.   LMP: No LMP recorded. Patient is perimenopausal. Spacing out significantly, menses in October slightly longer, some light bleeding in December.    The patient is sexually active. She denies dyspareunia.  The patient does perform self breast exams.  There is no notable family history of breast or ovarian cancer in her family.  The patient wears seatbelts: yes.   The patient has regular exercise: not asked.    The patient denies current symptoms of depression.     Review of Systems: ROS  Past Medical History:  Past Medical History:  Diagnosis Date  . Complication of anesthesia    VOMITED X 1 AFTER D&C 2015  . PONV (postoperative nausea and vomiting)    PONV after 1st  D&C  . Scoliosis    MILD-DX IN 6TH GRADE  . Vaginal bleeding, abnormal 2018    Past Surgical History:  Past Surgical History:  Procedure Laterality Date  . COLONOSCOPY WITH PROPOFOL N/A 01/24/2018   Procedure: COLONOSCOPY WITH PROPOFOL;  Surgeon: Midge Minium, MD;  Location: [facility];  Service: Endoscopy;  Laterality: N/A;  . DILATION AND CURETTAGE OF UTERUS    . HYSTEROSCOPY W/D&C N/A 05/07/2017   Procedure: DILATATION AND CURETTAGE /HYSTEROSCOPY;  Surgeon: [name], MD;  Location: [facility] OR[facility];  Service: Gynecology;  Laterality: N/A;    Gynecologic History:  No LMP recorded. Patient is perimenopausal. Last mammogram: 10/28/2017 Results were: BI-RAD I  Obstetric History: G0P0000  Family History:  Family History  Problem Relation Age of Onset  . Lung disease Mother   . Bladder Cancer Father   . Kidney disease Father   . Breast cancer Neg Hx     Social History:  Social History   Socioeconomic History  . Marital status:  Married    Spouse name: Not on file  . Number of children: Not on file  . Years of education: Not on file  . Highest education level: Not on file  Occupational History  . Not on file  Social Needs  . Financial resource strain: Not on file  . Food insecurity:    Worry: Not on file    Inability: Not on file  . Transportation needs:    Medical: Not on file    Non-medical: Not on file  Tobacco Use  . Smoking status: Never Smoker  . Smokeless tobacco: Never Used  Substance and Sexual Activity  . Alcohol use: No  . Drug use: No  . Sexual activity: Yes    Birth control/protection: Post-menopausal  Lifestyle  . Physical activity:    Days per week: 0 days    Minutes per session: 0 min  . Stress: Only a little  Relationships  . Social connections:    Talks on phone: More than three times a week    Gets together: More than three times a week    Attends religious service: More than 4 times per year    Active member of club or organization: Yes    Attends meetings of clubs or organizations: 1 to 4 times per year    Relationship status: Married  . Intimate partner violence:    Fear of current or ex partner: No    Emotionally abused: No  Physically abused: No    Forced sexual activity: No  Other Topics Concern  . Not on file  Social History Narrative  . Not on file    Allergies:  Allergies  Allergen Reactions  . Codeine Nausea Only    Medications: Prior to Admission medications   Medication Sig Start Date End Date Taking? Authorizing Provider  acetaminophen (TYLENOL) 325 MG tablet Take by mouth.   Yes [provider]  aspirin EC 81 MG tablet Take 81 mg by mouth daily as needed for mild pain.    Yes [provider]  cetirizine (ZYRTEC) 10 MG tablet Take 10 mg by mouth daily.   Yes [provider]  cholecalciferol (VITAMIN D) 1000 units tablet Take 1,000 Units by mouth daily.   Yes [provider]    Physical Exam Vitals: Blood  pressure 122/84, pulse 83, height 5\' 2"  (1.575 m), weight 133 lb 8 oz (60.6 kg).  General: NAD HEENT: normocephalic, anicteric Thyroid: no enlargement, no palpable nodules Pulmonary: No increased work of breathing, CTAB Cardiovascular: RRR, distal pulses 2+ Breast: Breast symmetrical, no tenderness, no palpable nodules or masses, no skin or nipple retraction present, no nipple discharge.  No axillary or supraclavicular lymphadenopathy. Abdomen: NABS, soft, non-tender, non-distended.  Umbilicus without lesions.  No hepatomegaly, splenomegaly or masses palpable. No evidence of hernia  Genitourinary:  External: Normal external female genitalia.  Normal urethral meatus, normal Bartholin's and Skene's glands.    Vagina: Normal vaginal mucosa, no evidence of prolapse.    Cervix: Grossly normal in appearance, no bleeding  Uterus: Non-enlarged, mobile, normal contour.  No CMT  Adnexa: ovaries non-enlarged, no adnexal masses  Rectal: deferred  Lymphatic: no evidence of inguinal lymphadenopathy Extremities: no edema, erythema, or tenderness Neurologic: Grossly intact Psychiatric: mood appropriate, affect full  Female chaperone present for pelvic and breast  portions of the physical exam     Assessment: 53 y.o. G0P0000 routine annual exam  Plan: Problem List Items Addressed This Visit    None    Visit Diagnoses    Encounter for gynecological examination without abnormal finding    -  Primary   Breast screening       Relevant Orders   MM 3D SCREEN BREAST BILATERAL   Screening for malignant neoplasm of cervix       Relevant Orders   Cytology - PAP      1) Mammogram - recommend yearly screening mammogram.  Mammogram Was ordered today  2) STI screening  was notoffered and therefore not obtained  3) ASCCP guidelines and rational discussed.  Patient opts for every 3 years screening interval  4) Osteoporosis  - per USPTF routine screening DEXA at age 53  5) Routine healthcare  maintenance including cholesterol, diabetes screening discussed managed by PCP  6) Colonoscopy UTD 2019  7) Return in about 1 year (around 11/25/2019) for annual.    [name], MD Domingo Pulse, Ms [facility] 11/24/2018, 10:00 AM

## 2018-11-24 NOTE — Patient Instructions (Signed)
[facility] [address] [place] [address]  [facility] [place]  [address] [place] [address]  Phone: [phone]  

## 2018-11-28 LAB — CYTOLOGY - PAP
Diagnosis: NEGATIVE
HPV: NOT DETECTED

## 2018-12-12 ENCOUNTER — Ambulatory Visit
Admission: RE | Admit: 2018-12-12 | Discharge: 2018-12-12 | Disposition: A | Discharge status: Home / Self Care | Payer: BC Managed Care – PPO | Source: Ambulatory Visit | Attending: Obstetrics and Gynecology | Admitting: Obstetrics and Gynecology

## 2018-12-12 DIAGNOSIS — Z1239 Encounter for other screening for malignant neoplasm of breast: Secondary | ICD-10-CM | POA: Insufficient documentation

## 2019-04-18 ENCOUNTER — Other Ambulatory Visit: Payer: Self-pay

## 2019-04-18 ENCOUNTER — Encounter: Payer: Self-pay | Admitting: Urology

## 2019-04-18 ENCOUNTER — Ambulatory Visit: Payer: BC Managed Care – PPO | Admitting: Urology

## 2019-04-18 VITALS — BP 118/80 | HR 84 | Ht 62.0 in | Wt 132.0 lb

## 2019-04-18 DIAGNOSIS — R3129 Other microscopic hematuria: Secondary | ICD-10-CM

## 2019-04-18 LAB — MICROSCOPIC EXAMINATION: Bacteria, UA: NONE SEEN

## 2019-04-18 LAB — URINALYSIS, COMPLETE
Bilirubin, UA: NEGATIVE
Glucose, UA: NEGATIVE
Ketones, UA: NEGATIVE
Leukocytes,UA: NEGATIVE
Nitrite, UA: NEGATIVE
Protein,UA: NEGATIVE
Specific Gravity, UA: 1.025 (ref 1.005–1.030)
Urobilinogen, Ur: 0.2 mg/dL (ref 0.2–1.0)
pH, UA: 5 (ref 5.0–7.5)

## 2019-04-18 NOTE — Progress Notes (Signed)
04/18/2019 9:17 AM   Kathleen Moses 09-24-1966 [id number]  Referring provider: Idelle Crouch, MD [facility] [place], [place] [address]  Chief Complaint  Patient presents with  . Hematuria    New Patient     HPI: 53 yo F referred from further evaluation of microscopic hematuria.  She is seen and evaluated by her primary care physician in April 2020 at which time she gave a urine.  She was asymptomatic at the time.  Urinalysis revealed 4-10 red blood cells per high-powered field without the evidence of infection.  She denies any history of gross hematuria.  UA today is unremarkable without blood.   She is perimenopausal.  She has not spotting on the day of UA collection at her PCP.  Father with bladder cancer (former smoker).  He was diagnosed several years ago and is undergoing treatment in [place] with BCG.  Never smoker.  No history of kidney stones.  No flank pain.  No urinary issues other than occasional stress incontinence which is not bothersome.  She does report today that sometimes she feels some puffiness in her right lower quadrant over the past several years which comes and goes.  She reports that she has several family members have the same issue including her father.  She denies any constipation.  She is not currently having symptoms.   PMH: Past Medical History:  Diagnosis Date  . Complication of anesthesia    VOMITED X 1 AFTER D&C 2015  . PONV (postoperative nausea and vomiting)    PONV after 1st  D&C  . Scoliosis    MILD-DX IN 6TH GRADE  . Vaginal bleeding, abnormal 2018    Surgical History: Past Surgical History:  Procedure Laterality Date  . COLONOSCOPY WITH PROPOFOL N/A 01/24/2018   Procedure: COLONOSCOPY WITH PROPOFOL;  Surgeon: [name], MD;  Location: [facility];  Service: Endoscopy;  Laterality: N/A;  . DILATION AND CURETTAGE OF UTERUS    . HYSTEROSCOPY W/D&C N/A 05/07/2017   Procedure: DILATATION  AND CURETTAGE /HYSTEROSCOPY;  Surgeon: Malachy Mood, MD;  Location: [facility];  Service: Gynecology;  Laterality: N/A;    Home Medications:  Allergies as of 04/18/2019      Reactions   Codeine Nausea Only      Medication List       Accurate as of April 18, 2019  9:17 AM. If you have any questions, ask your nurse or doctor.        acetaminophen 325 MG tablet Commonly known as:  TYLENOL Take by mouth.   aspirin EC 81 MG tablet Take 81 mg by mouth daily as needed for mild pain.   cetirizine 10 MG tablet Commonly known as:  ZYRTEC Take 10 mg by mouth daily.   cholecalciferol 1000 units tablet Commonly known as:  VITAMIN D Take 1,000 Units by mouth daily.       Allergies:  Allergies  Allergen Reactions  . Codeine Nausea Only    Family History: Family History  Problem Relation Age of Onset  . Lung disease Mother   . Bladder Cancer Father   . Kidney disease Father   . Breast cancer Neg Hx     Social History:  reports that she has never smoked. She has never used smokeless tobacco. She reports that she does not drink alcohol or use drugs.  ROS: UROLOGY Frequent Urination?: No Hard to postpone urination?: No Burning/pain with urination?: No Get up at night to urinate?: Yes  Leakage of urine?: No Urine stream starts and stops?: No Trouble starting stream?: No Do you have to strain to urinate?: No Blood in urine?: Yes Urinary tract infection?: No Sexually transmitted disease?: No Injury to kidneys or bladder?: No Painful intercourse?: No Weak stream?: No Currently pregnant?: No Vaginal bleeding?: No Last menstrual period?: n  Gastrointestinal Nausea?: No Vomiting?: No Indigestion/heartburn?: No Diarrhea?: No Constipation?: No  Constitutional Fever: No Night sweats?: No Weight loss?: No Fatigue?: No  Skin Skin rash/lesions?: No Itching?: No  Eyes Blurred vision?: No Double vision?: No  Ears/Nose/Throat Sore throat?: No Sinus problems?:  No  Hematologic/Lymphatic Swollen glands?: No Easy bruising?: No  Cardiovascular Leg swelling?: No Chest pain?: No  Respiratory Cough?: No Shortness of breath?: No  Endocrine Excessive thirst?: No  Musculoskeletal Back pain?: No Joint pain?: No  Neurological Headaches?: No Dizziness?: No  Psychologic Depression?: No Anxiety?: No  Physical Exam: BP 118/80   Pulse 84   Ht 5\' 2"  (1.575 m)   Wt 132 lb (59.9 kg)   BMI 24.14 kg/m   Constitutional:  Alert and oriented, No acute distress. HEENT: [place] AT, moist mucus membranes.  Trachea midline, no masses. Cardiovascular: No clubbing, cyanosis, or edema. Respiratory: Normal respiratory effort, no increased work of breathing. GI: Abdomen is soft, nontender, nondistended, no abdominal masses Skin: No rashes, bruises or suspicious lesions. Neurologic: Grossly intact, no focal deficits, moving all 4 extremities. Psychiatric: Normal mood and affect.  Laboratory Data: Creatinine 0.8 on 02/15/2019 Hemoglobin 14.5  Urinalysis UA today reviewed, negative for RBC  Assessment & Plan:    1. Microscopic hematuria We discussed the differential diagnosis for microscopic hematuria including nephrolithiasis, renal or upper tract tumors, bladder stones, UTIs, or bladder tumors as well as undetermined etiologies. Per AUA guidelines, I did recommend complete microscopic hematuria evaluation including CTU, possible urine cytology, and office cystoscopy.  - Urinalysis, Complete - CT HEMATURIA WORKUP; Future   Return in about 4 weeks (around 05/16/2019), or f/u cystoscopy/ CT urogram.  Kathleen ScotlandAshley Smayan Hackbart, MD  [facility] [address], [address] [place], [place] [id number] [phone]

## 2019-04-18 NOTE — Patient Instructions (Signed)
Cystoscopy    Cystoscopy is a procedure that is used to help diagnose and sometimes treat conditions that affect that lower urinary tract. The lower urinary tract includes the bladder and the tube that drains urine from the bladder out of the body (urethra). Cystoscopy is performed with a thin, tube-shaped instrument with a light and camera at the end (cystoscope). The cystoscope may be hard (rigid) or flexible, depending on the goal of the procedure.The cystoscope is inserted through the urethra, into the bladder.  Cystoscopy may be recommended if you have:   Urinary tractinfections that keep coming back (recurring).   Blood in the urine (hematuria).   Loss of bladder control (urinary incontinence) or an overactive bladder.   Unusual cells found in a urine sample.   A blockage in the urethra.   Painful urination.   An abnormality in the bladder found during an intravenous pyelogram (IVP) or CT scan.  Cystoscopy may also be done to remove a sample of tissue to be examined under a microscope (biopsy).  Tell a health care provider about:   Any allergies you have.   All medicines you are taking, including vitamins, herbs, eye drops, creams, and over-the-counter medicines.   Any problems you or family members have had with anesthetic medicines.   Any blood disorders you have.   Any surgeries you have had.   Any medical conditions you have.   Whether you are pregnant or may be pregnant.  What are the risks?  Generally, this is a safe procedure. However, problems may occur, including:   Infection.   Bleeding.   Allergic reactions to medicines.   Damage to other structures or organs.  What happens before the procedure?   Ask your health care provider about:  ? Changing or stopping your regular medicines. This is especially important if you are taking diabetes medicines or blood thinners.  ? Taking medicines such as aspirin and ibuprofen. These medicines can thin your blood. Do not take these medicines  before your procedure if your health care provider instructs you not to.   Follow instructions from your health care provider about eating or drinking restrictions.   You may be given antibiotic medicine to help prevent infection.   You may have an exam or testing, such as X-rays of the bladder, urethra, or kidneys.   You may have urine tests to check for signs of infection.   Plan to have someone take you home after the procedure.  What happens during the procedure?   To reduce your risk of infection,your health care team will wash or sanitize their hands.   You will be given one or more of the following:  ? A medicine to help you relax (sedative).  ? A medicine to numb the area (local anesthetic).   The area around the opening of your urethra will be cleaned.   The cystoscope will be passed through your urethra into your bladder.   Germ-free (sterile)fluid will flow through the cystoscope to fill your bladder. The fluid will stretch your bladder so that your surgeon can clearly examine your bladder walls.   The cystoscope will be removed and your bladder will be emptied.  The procedure may vary among health care providers and hospitals.  What happens after the procedure?   You may have some soreness or pain in your abdomen and urethra. Medicines will be available to help you.   You may have some blood in your urine.   Do not drive for   24 hours if you received a sedative.  This information is not intended to replace advice given to you by your health care provider. Make sure you discuss any questions you have with your health care provider.  Document Released: 10/23/2000 Document Revised: 08/06/2017 Document Reviewed: 09/12/2015  Elsevier Interactive Patient Education  2019 [employer]

## 2019-05-02 ENCOUNTER — Ambulatory Visit: Payer: Self-pay | Admitting: Urology

## 2019-05-04 ENCOUNTER — Other Ambulatory Visit: Payer: Self-pay

## 2019-05-04 ENCOUNTER — Ambulatory Visit
Admission: RE | Admit: 2019-05-04 | Discharge: 2019-05-04 | Disposition: A | Discharge status: Home / Self Care | Payer: BC Managed Care – PPO | Source: Ambulatory Visit | Attending: Urology | Admitting: Urology

## 2019-05-04 ENCOUNTER — Telehealth: Payer: Self-pay

## 2019-05-04 DIAGNOSIS — R3129 Other microscopic hematuria: Secondary | ICD-10-CM | POA: Diagnosis not present

## 2019-05-04 MED ORDER — IOHEXOL 300 MG/ML  SOLN
125.0000 mL | Freq: Once | INTRAMUSCULAR | Status: AC | PRN
  Administered 2019-05-04: 125 mL via INTRAVENOUS

## 2019-05-04 NOTE — Telephone Encounter (Signed)
Advised patient of CT results. Patient verbalized understanding.

## 2019-05-04 NOTE — Telephone Encounter (Signed)
-----   Message from Hollice Espy, MD sent at 05/04/2019 12:45 PM EDT ----- CT scan looks great. See you for cystoscopy in a few weeks.    Hollice Espy, MD

## 2019-05-16 ENCOUNTER — Encounter: Payer: Self-pay | Admitting: Urology

## 2019-05-16 ENCOUNTER — Ambulatory Visit: Payer: BC Managed Care – PPO | Admitting: Urology

## 2019-05-16 ENCOUNTER — Other Ambulatory Visit: Payer: Self-pay

## 2019-05-16 VITALS — BP 138/82 | HR 80 | Ht 62.0 in | Wt 128.0 lb

## 2019-05-16 DIAGNOSIS — R3129 Other microscopic hematuria: Secondary | ICD-10-CM | POA: Diagnosis not present

## 2019-05-16 DIAGNOSIS — N362 Urethral caruncle: Secondary | ICD-10-CM

## 2019-05-16 NOTE — Progress Notes (Signed)
   05/16/19  CC:  Chief Complaint  Patient presents with  . Cysto    HPI: 53 year old former smoker who presents today for cystoscopy to complete her microscopic hematuria work-up.  In the interim, she underwent CT urogram which was unremarkable.  She had no kidney stones masses or lesions.  Uterine fibroids were noted and she is being followed by Dr. Georgianne Fick for this.  She does have some microscopic blood in her urine today but is also spotting.   Today's Vitals   05/16/19 1404  BP: 138/82  Pulse: 80  Weight: 128 lb (58.1 kg)  Height: 5\' 2"  (1.575 m)   Body mass index is 23.41 kg/m. NED. A&Ox3.   No respiratory distress   Abd soft, NT, ND Normal external genitalia with patent urethral meatus  Cystoscopy Procedure Note  Patient identification was confirmed, informed consent was obtained, and patient was prepped using Betadine solution.  Lidocaine jelly was administered per urethral meatus.    Procedure: - Flexible cystoscope introduced, without any difficulty.   - Thorough search of the bladder revealed:    normal urethral meatus    normal urothelium    no stones    no ulcers     no tumors    Tiny polyp at the 1 o'clock position of the proximal urethra, very benign-appearing    no trabeculation  - Ureteral orifices were normal in position and appearance.  Post-Procedure: - Patient tolerated the procedure well  Assessment/ Plan:  1. Microscopic hematuria This was negative hematuria work-up including negative CT urogram and unremarkable cystoscopy today Advised the patient if she continues to have microscopic blood in her urine, she should be reevaluated every 2 to 3 years with renal ultrasound and cystoscopy She is agreeable this plan - Urinalysis, Complete  2. Urethral polyp Tiny benign appearing urethral polyp, incidental I explained that these are benign in nature, no need for biopsy  F/u 2-3 years with hematuria persists  Hollice Espy, MD

## 2019-05-17 LAB — URINALYSIS, COMPLETE
Bilirubin, UA: NEGATIVE
Glucose, UA: NEGATIVE
Ketones, UA: NEGATIVE
Nitrite, UA: NEGATIVE
Protein,UA: NEGATIVE
Specific Gravity, UA: 1.025 (ref 1.005–1.030)
Urobilinogen, Ur: 0.2 mg/dL (ref 0.2–1.0)
pH, UA: 5 (ref 5.0–7.5)

## 2019-05-17 LAB — MICROSCOPIC EXAMINATION

## 2019-11-08 DIAGNOSIS — R1013 Epigastric pain: Secondary | ICD-10-CM | POA: Insufficient documentation

## 2019-11-08 DIAGNOSIS — R070 Pain in throat: Secondary | ICD-10-CM | POA: Insufficient documentation

## 2019-11-08 DIAGNOSIS — F411 Generalized anxiety disorder: Secondary | ICD-10-CM | POA: Insufficient documentation

## 2019-11-13 ENCOUNTER — Other Ambulatory Visit: Payer: Self-pay | Admitting: Obstetrics and Gynecology

## 2019-11-13 DIAGNOSIS — Z1231 Encounter for screening mammogram for malignant neoplasm of breast: Secondary | ICD-10-CM

## 2019-12-14 ENCOUNTER — Ambulatory Visit: Payer: BC Managed Care – PPO | Admitting: Obstetrics and Gynecology

## 2019-12-15 ENCOUNTER — Other Ambulatory Visit: Payer: Self-pay

## 2019-12-15 ENCOUNTER — Ambulatory Visit
Admission: RE | Admit: 2019-12-15 | Discharge: 2019-12-15 | Disposition: A | Discharge status: Home / Self Care | Payer: BC Managed Care – PPO | Source: Ambulatory Visit | Attending: Obstetrics and Gynecology | Admitting: Obstetrics and Gynecology

## 2019-12-15 ENCOUNTER — Other Ambulatory Visit (HOSPITAL_COMMUNITY)
Admission: RE | Admit: 2019-12-15 | Discharge: 2019-12-15 | Disposition: A | Discharge status: Home / Self Care | Payer: BC Managed Care – PPO | Source: Ambulatory Visit | Attending: Obstetrics and Gynecology | Admitting: Obstetrics and Gynecology

## 2019-12-15 ENCOUNTER — Ambulatory Visit: Payer: BC Managed Care – PPO | Admitting: Obstetrics and Gynecology

## 2019-12-15 ENCOUNTER — Ambulatory Visit (INDEPENDENT_AMBULATORY_CARE_PROVIDER_SITE_OTHER): Payer: BC Managed Care – PPO | Admitting: Obstetrics and Gynecology

## 2019-12-15 ENCOUNTER — Encounter: Payer: Self-pay | Admitting: Obstetrics and Gynecology

## 2019-12-15 VITALS — BP 122/86 | Wt 115.0 lb

## 2019-12-15 DIAGNOSIS — Z124 Encounter for screening for malignant neoplasm of cervix: Secondary | ICD-10-CM | POA: Insufficient documentation

## 2019-12-15 DIAGNOSIS — Z1231 Encounter for screening mammogram for malignant neoplasm of breast: Secondary | ICD-10-CM | POA: Diagnosis present

## 2019-12-15 DIAGNOSIS — Z01419 Encounter for gynecological examination (general) (routine) without abnormal findings: Secondary | ICD-10-CM

## 2019-12-15 DIAGNOSIS — Z1239 Encounter for other screening for malignant neoplasm of breast: Secondary | ICD-10-CM

## 2019-12-15 NOTE — Progress Notes (Signed)
Gynecology Annual Exam  PCP: Marguarite Arbour, MD  Chief Complaint:  Chief Complaint  Patient presents with  . Gynecologic Exam    Fibroid/left ovarian  cyst through CT 04/2019    History of Present Illness:Patient is a 54 y.o. G0P0000 presents for annual exam. The patient has no complaints today.   LMP: Patient's last menstrual period was 11/08/2019. Only one menstrual cycle in December of last year none since  The patient is sexually active. She denies dyspareunia.  The patient does perform self breast exams.  There is no notable family history of breast or ovarian cancer in her family.  The patient wears seatbelts: yes.   The patient has regular exercise: not asked.    The patient reports current symptoms of depression.   Mom has recurrence of lung cancer.  Currently on hydroxyzine prn  Review of Systems: Review of Systems  Constitutional: Negative for chills and fever.  HENT: Negative for congestion.   Respiratory: Negative for cough and shortness of breath.   Cardiovascular: Negative for chest pain and palpitations.  Gastrointestinal: Negative for abdominal pain, constipation, diarrhea, heartburn, nausea and vomiting.  Genitourinary: Negative for dysuria, frequency and urgency.  Skin: Negative for itching and rash.  Neurological: Negative for dizziness and headaches.  Endo/Heme/Allergies: Negative for polydipsia.  Psychiatric/Behavioral: Negative for depression.    Past Medical History:  Past Medical History:  Diagnosis Date  . Complication of anesthesia    VOMITED X 1 AFTER D&C 2015  . PONV (postoperative nausea and vomiting)    PONV after 1st  D&C  . Scoliosis    MILD-DX IN 6TH GRADE  . Vaginal bleeding, abnormal 2018    Past Surgical History:  Past Surgical History:  Procedure Laterality Date  . COLONOSCOPY WITH PROPOFOL N/A 01/24/2018   Procedure: COLONOSCOPY WITH PROPOFOL;  Surgeon: Midge Minium, MD;  Location: [facility];  Service:  Endoscopy;  Laterality: N/A;  . DILATION AND CURETTAGE OF UTERUS    . HYSTEROSCOPY WITH D & C N/A 05/07/2017   Procedure: DILATATION AND CURETTAGE /HYSTEROSCOPY;  Surgeon: [name], MD;  Location: [facility];  Service: Gynecology;  Laterality: N/A;    Gynecologic History:  Patient's last menstrual period was 11/08/2019. Last Pap: Results were: 11/24/2018 NIL and HR HPV negative  Last mammogram: 12/16/2019 Results were: BI-RAD I  Obstetric History: G0P0000  Family History:  Family History  Problem Relation Age of Onset  . Lung disease Mother   . Bladder Cancer Father   . Kidney disease Father   . Breast cancer Neg Hx     Social History:  Social History   Socioeconomic History  . Marital status: Married    Spouse name: Not on file  . Number of children: Not on file  . Years of education: Not on file  . Highest education level: Not on file  Occupational History  . Not on file  Tobacco Use  . Smoking status: Never Smoker  . Smokeless tobacco: Never Used  Substance and Sexual Activity  . Alcohol use: No  . Drug use: No  . Sexual activity: Yes    Birth control/protection: Post-menopausal  Other Topics Concern  . Not on file  Social History Narrative  . Not on file   Social Determinants of Health   Financial Resource Strain:   . Difficulty of Paying Living Expenses: Not on file  Food Insecurity:   . Worried About Programme researcher, broadcasting/film/video in the Last Year: Not on file  .  Ran Out of Food in the Last Year: Not on file  Transportation Needs:   . Lack of Transportation (Medical): Not on file  . Lack of Transportation (Non-Medical): Not on file  Physical Activity:   . Days of Exercise per Week: Not on file  . Minutes of Exercise per Session: Not on file  Stress:   . Feeling of Stress : Not on file  Social Connections:   . Frequency of Communication with Friends and Family: Not on file  . Frequency of Social Gatherings with Friends and Family: Not on file  . Attends  Religious Services: Not on file  . Active Member of Clubs or Organizations: Not on file  . Attends Banker Meetings: Not on file  . Marital Status: Not on file  Intimate Partner Violence:   . Fear of Current or Ex-Partner: Not on file  . Emotionally Abused: Not on file  . Physically Abused: Not on file  . Sexually Abused: Not on file    Allergies:  Allergies  Allergen Reactions  . Codeine Nausea Only    Medications: Prior to Admission medications   Medication Sig Start Date End Date Taking? Authorizing Provider  acetaminophen (TYLENOL) 325 MG tablet Take by mouth.   Yes [provider]  aspirin EC 81 MG tablet Take 81 mg by mouth daily as needed for mild pain.    Yes [provider]  cetirizine (ZYRTEC) 10 MG tablet Take 10 mg by mouth daily.   Yes [provider]  cholecalciferol (VITAMIN D) 1000 units tablet Take 1,000 Units by mouth daily.   Yes [provider]  Multiple Vitamin (MULTIVITAMIN) capsule Take 1 capsule by mouth daily.   Yes [provider]    Physical Exam Vitals: Blood pressure 122/86, weight 115 lb (52.2 kg), last menstrual period 11/08/2019.  General: NAD HEENT: normocephalic, anicteric Thyroid: no enlargement, no palpable nodules Pulmonary: No increased work of breathing, CTAB Cardiovascular: RRR, distal pulses 2+ Breast: Breast symmetrical, no tenderness, no palpable nodules or masses, no skin or nipple retraction present, no nipple discharge.  No axillary or supraclavicular lymphadenopathy. Abdomen: NABS, soft, non-tender, non-distended.  Umbilicus without lesions.  No hepatomegaly, splenomegaly or masses palpable. No evidence of hernia  Genitourinary:  External: Normal external female genitalia.  Normal urethral meatus, normal Bartholin's and Skene's glands.    Vagina: Normal vaginal mucosa, no evidence of prolapse.    Cervix: Grossly normal in appearance, no bleeding  Uterus: Non-enlarged,  mobile, normal contour.  No CMT  Adnexa: ovaries non-enlarged, no adnexal masses  Rectal: deferred  Lymphatic: no evidence of inguinal lymphadenopathy Extremities: no edema, erythema, or tenderness Neurologic: Grossly intact Psychiatric: mood appropriate, affect full  Female chaperone present for pelvic and breast  portions of the physical exam     Assessment: 54 y.o. G0P0000 routine annual exam  Plan: Problem List Items Addressed This Visit    None    Visit Diagnoses    Encounter for gynecological examination without abnormal finding    -  Primary   Screening for malignant neoplasm of cervix       Relevant Orders   Cytology - PAP   Breast screening          1) Mammogram - recommend yearly screening mammogram.  Mammogram Is up to date  2) STI screening  was notoffered and therefore not obtained  3) ASCCP guidelines and rational discussed.  Patient opts for yearly screening interval  4) Osteoporosis  - per USPTF  routine screening DEXA at age 12  5) Routine healthcare maintenance including cholesterol, diabetes screening discussed managed by PCP  6) Return in about 1 year (around 12/14/2020) for annual.    Malachy Mood, MD [facility] 12/15/2019, 2:45 PM

## 2019-12-19 LAB — CYTOLOGY - PAP
Comment: NEGATIVE
Diagnosis: NEGATIVE
High risk HPV: NEGATIVE

## 2020-02-02 DIAGNOSIS — K219 Gastro-esophageal reflux disease without esophagitis: Secondary | ICD-10-CM | POA: Insufficient documentation

## 2020-03-12 ENCOUNTER — Other Ambulatory Visit: Payer: Self-pay | Admitting: Internal Medicine

## 2020-03-12 DIAGNOSIS — K769 Liver disease, unspecified: Secondary | ICD-10-CM

## 2020-03-18 ENCOUNTER — Ambulatory Visit
Admission: RE | Admit: 2020-03-18 | Discharge: 2020-03-18 | Disposition: A | Discharge status: Home / Self Care | Payer: BC Managed Care – PPO | Source: Ambulatory Visit | Attending: Internal Medicine | Admitting: Internal Medicine

## 2020-03-18 ENCOUNTER — Other Ambulatory Visit: Payer: Self-pay

## 2020-03-18 DIAGNOSIS — K769 Liver disease, unspecified: Secondary | ICD-10-CM | POA: Insufficient documentation

## 2020-05-06 ENCOUNTER — Other Ambulatory Visit: Payer: Self-pay

## 2020-05-06 ENCOUNTER — Ambulatory Visit (INDEPENDENT_AMBULATORY_CARE_PROVIDER_SITE_OTHER): Payer: BC Managed Care – PPO | Admitting: Gastroenterology

## 2020-05-06 ENCOUNTER — Encounter: Payer: Self-pay | Admitting: Gastroenterology

## 2020-05-06 VITALS — BP 130/76 | HR 80 | Temp 97.8°F | Ht 62.0 in | Wt 125.6 lb

## 2020-05-06 DIAGNOSIS — K219 Gastro-esophageal reflux disease without esophagitis: Secondary | ICD-10-CM

## 2020-05-06 DIAGNOSIS — R319 Hematuria, unspecified: Secondary | ICD-10-CM | POA: Insufficient documentation

## 2020-05-06 NOTE — Progress Notes (Signed)
Gastroenterology Consultation  Referring Provider:     Marguarite Arbour, MD Primary Care Physician:  Marguarite Arbour, MD Primary Gastroenterologist:  Dr. Servando Snare     Reason for Consultation:     GERD        HPI:   GENNETT Moses is a 54 y.o. y/o female referred for consultation & management of GERD by Dr. Judithann Sheen, Duane Lope, MD.  This patient comes today after seeing me back in 2019 for screening colonoscopy.  The patient had a normal screening colonoscopy at that time and was recommended to have a repeat colonoscopy in 10 years unless she is having any new problems.  He was seen by her primary care provider, Dr. Judithann Sheen, back in the beginning of May.  The patient had ultrasound of the right upper quadrant that showed:  IMPRESSION: 1. Simple cyst in the right lobe of the liver corresponding to the focus of decreased attenuation seen on prior CT. No other focal liver lesions evident.  2. There are two 4 mm polyps in the gallbladder. Per consensus guidelines, polyps of this small size do not warrant additional imaging surveillance. No gallstones, gallbladder wall thickening, or pericholecystic fluid.  The patient reports that she is aware that she had a skin tag on her previous colonoscopy and reports that she now feels something on the other side of her rectum that was uncomfortable when she sat but has now become less of an issue.  She is concerned because somebody at work had reported that they had rectal cancer with similar symptoms.  The patient also reports that she has this fullness in her chest and thinks it may be caused by reflux.  It gets better when she takes Nexium in a low dose and sometimes it is better with Pepcid and Tums.  She also takes a probiotic.  The patient was under a lot of stress and reports that she was having diarrhea at that time but no longer having diarrhea.  She also had lost some weight but states that it is now coming back.  Past Medical History:    Diagnosis Date  . Complication of anesthesia    VOMITED X 1 AFTER D&C 2015  . PONV (postoperative nausea and vomiting)    PONV after 1st  D&C  . Scoliosis    MILD-DX IN 6TH GRADE  . Vaginal bleeding, abnormal 2018    Past Surgical History:  Procedure Laterality Date  . COLONOSCOPY WITH PROPOFOL N/A 01/24/2018   Procedure: COLONOSCOPY WITH PROPOFOL;  Surgeon: Midge Minium, MD;  Location: [facility];  Service: Endoscopy;  Laterality: N/A;  . DILATION AND CURETTAGE OF UTERUS    . HYSTEROSCOPY WITH D & C N/A 05/07/2017   Procedure: DILATATION AND CURETTAGE /HYSTEROSCOPY;  Surgeon: [name], MD;  Location: [facility];  Service: Gynecology;  Laterality: N/A;    Prior to Admission medications   Medication Sig Start Date End Date Taking? Authorizing Provider  acetaminophen (TYLENOL) 325 MG tablet Take by mouth.    [provider]  aspirin EC 81 MG tablet Take 81 mg by mouth daily as needed for mild pain.     [provider]  cetirizine (ZYRTEC) 10 MG tablet Take 10 mg by mouth daily.    [provider]  cholecalciferol (VITAMIN D) 1000 units tablet Take 1,000 Units by mouth daily.    [provider]  esomeprazole (NEXIUM) 20 MG capsule Take by mouth. 01/15/20 01/14/21  [provider]  famotidine (PEPCID) 20 MG tablet Take by mouth.    [provider]  hydrOXYzine (ATARAX/VISTARIL) 25 MG tablet 1TAB 2-3 TIMES DAILY PRN FOR ANXIETY 10/15/19   [provider]  Multiple Vitamin (MULTIVITAMIN) capsule Take 1 capsule by mouth daily.    [provider]    Family History  Problem Relation Age of Onset  . Lung disease Mother   . Bladder Cancer Father   . Kidney disease Father   . Breast cancer Neg Hx      Social History   Tobacco Use  . Smoking status: Never Smoker  . Smokeless tobacco: Never Used  Vaping Use  . Vaping Use: Never used  Substance Use Topics  . Alcohol use: No  . Drug use: No     Allergies as of 05/06/2020 - Review Complete 12/15/2019  Allergen Reaction Noted  . Codeine Nausea Only 11/22/2017    Review of Systems:    All systems reviewed and negative except where noted in HPI.   Physical Exam:  There were no vitals taken for this visit. No LMP recorded. Patient is perimenopausal. General:   Alert,  Well-developed, well-nourished, pleasant and cooperative in NAD Head:  Normocephalic and atraumatic. Eyes:  Sclera clear, no icterus.   Conjunctiva pink. Ears:  Normal auditory acuity. Neck:  Supple; no masses or thyromegaly. Lungs:  Respirations even and unlabored.  Clear throughout to auscultation.   No wheezes, crackles, or rhonchi. No acute distress. Heart:  Regular rate and rhythm; no murmurs, clicks, rubs, or gallops. Abdomen:  Normal bowel sounds.  No bruits.  Soft, non-tender and non-distended without masses, hepatosplenomegaly or hernias noted.  No guarding or rebound tenderness.  Negative Carnett sign.   Rectal: Normal rectal tone without any masses or lesions noted.  The skin tag was found on rectal exam.  Pulses:  Normal pulses noted. Extremities:  No clubbing or edema.  No cyanosis. Neurologic:  Alert and oriented x3;  grossly normal neurologically. Skin:  Intact without significant lesions or rashes.  No jaundice. Lymph Nodes:  No significant cervical adenopathy. Psych:  Alert and cooperative. Normal mood and affect.  Imaging Studies: No results found.  Assessment and Plan:   Kathleen Moses is a 54 y.o. y/o female who comes in today with a history of GERD and was told by her dental hygienist that her changes to her dentition may be related to reflux.  The patient does not have any heartburn but does have symptoms of regurgitation but she states it does not come up into her mouth.  She also states that she has a fullness in her chest with discomfort that she gets relief from by taking acid suppression.  The patient will be set up for an upper  endoscopy to look for any sign of a hiatal hernia, Barrett's esophagus or esophagitis.  The patient has been explained the plan and agrees with it.    [name], MD. [name]    Note: This dictation was prepared with Dragon dictation along with smaller phrase technology. Any transcriptional errors that result from this process are unintentional.

## 2020-05-08 ENCOUNTER — Other Ambulatory Visit: Payer: Self-pay

## 2020-05-08 DIAGNOSIS — K219 Gastro-esophageal reflux disease without esophagitis: Secondary | ICD-10-CM

## 2020-06-24 ENCOUNTER — Other Ambulatory Visit: Payer: Self-pay

## 2020-06-24 ENCOUNTER — Encounter: Payer: Self-pay | Admitting: Gastroenterology

## 2020-06-27 ENCOUNTER — Other Ambulatory Visit
Admission: RE | Admit: 2020-06-27 | Discharge: 2020-06-27 | Disposition: A | Discharge status: Home / Self Care | Payer: BC Managed Care – PPO | Source: Ambulatory Visit | Attending: Gastroenterology | Admitting: Gastroenterology

## 2020-06-27 ENCOUNTER — Other Ambulatory Visit: Payer: Self-pay

## 2020-06-27 DIAGNOSIS — Z20822 Contact with and (suspected) exposure to covid-19: Secondary | ICD-10-CM | POA: Insufficient documentation

## 2020-06-27 DIAGNOSIS — Z01812 Encounter for preprocedural laboratory examination: Secondary | ICD-10-CM | POA: Insufficient documentation

## 2020-06-28 LAB — SARS CORONAVIRUS 2 (TAT 6-24 HRS): SARS Coronavirus 2: NEGATIVE

## 2020-06-28 NOTE — Discharge Instructions (Signed)
General Anesthesia, Adult, Care After This sheet gives you information about how to care for yourself after your procedure. Your health care provider may also give you more specific instructions. If you have problems or questions, contact your health care provider. What can I expect after the procedure? After the procedure, the following side effects are common:  Pain or discomfort at the IV site.  Nausea.  Vomiting.  Sore throat.  Trouble concentrating.  Feeling cold or chills.  Weak or tired.  Sleepiness and fatigue.  Soreness and body aches. These side effects can affect parts of the body that were not involved in surgery. Follow these instructions at home:  For at least 24 hours after the procedure:  Have a responsible adult stay with you. It is important to have someone help care for you until you are awake and alert.  Rest as needed.  Do not: ? Participate in activities in which you could fall or become injured. ? Drive. ? Use heavy machinery. ? Drink alcohol. ? Take sleeping pills or medicines that cause drowsiness. ? Make important decisions or sign legal documents. ? Take care of children on your own. Eating and drinking  Follow any instructions from your health care provider about eating or drinking restrictions.  When you feel hungry, start by eating small amounts of foods that are soft and easy to digest (bland), such as toast. Gradually return to your regular diet.  Drink enough fluid to keep your urine pale yellow.  If you vomit, rehydrate by drinking water, juice, or clear broth. General instructions  If you have sleep apnea, surgery and certain medicines can increase your risk for breathing problems. Follow instructions from your health care provider about wearing your sleep device: ? Anytime you are sleeping, including during daytime naps. ? While taking prescription pain medicines, sleeping medicines, or medicines that make you drowsy.  Return to  your normal activities as told by your health care provider. Ask your health care provider what activities are safe for you.  Take over-the-counter and prescription medicines only as told by your health care provider.  If you smoke, do not smoke without supervision.  Keep all follow-up visits as told by your health care provider. This is important. Contact a health care provider if:  You have nausea or vomiting that does not get better with medicine.  You cannot eat or drink without vomiting.  You have pain that does not get better with medicine.  You are unable to pass urine.  You develop a skin rash.  You have a fever.  You have redness around your IV site that gets worse. Get help right away if:  You have difficulty breathing.  You have chest pain.  You have blood in your urine or stool, or you vomit blood. Summary  After the procedure, it is common to have a sore throat or nausea. It is also common to feel tired.  Have a responsible adult stay with you for the first 24 hours after general anesthesia. It is important to have someone help care for you until you are awake and alert.  When you feel hungry, start by eating small amounts of foods that are soft and easy to digest (bland), such as toast. Gradually return to your regular diet.  Drink enough fluid to keep your urine pale yellow.  Return to your normal activities as told by your health care provider. Ask your health care provider what activities are safe for you. This information is not   intended to replace advice given to you by your health care provider. Make sure you discuss any questions you have with your health care provider. Document Revised: 10/29/2017 Document Reviewed: 06/11/2017 Elsevier Patient Education  2020 Elsevier Inc.  

## 2020-07-01 ENCOUNTER — Encounter
Admission: RE | Disposition: A | Discharge status: Home / Self Care | Payer: Self-pay | Source: Home / Self Care | Attending: Gastroenterology

## 2020-07-01 ENCOUNTER — Ambulatory Visit: Payer: BC Managed Care – PPO | Admitting: Anesthesiology

## 2020-07-01 ENCOUNTER — Other Ambulatory Visit: Payer: Self-pay

## 2020-07-01 ENCOUNTER — Encounter: Payer: Self-pay | Admitting: Gastroenterology

## 2020-07-01 ENCOUNTER — Ambulatory Visit
Admission: RE | Admit: 2020-07-01 | Discharge: 2020-07-01 | Disposition: A | Discharge status: Home / Self Care | Payer: BC Managed Care – PPO | Attending: Gastroenterology | Admitting: Gastroenterology

## 2020-07-01 DIAGNOSIS — Z79899 Other long term (current) drug therapy: Secondary | ICD-10-CM | POA: Diagnosis not present

## 2020-07-01 DIAGNOSIS — R12 Heartburn: Secondary | ICD-10-CM

## 2020-07-01 DIAGNOSIS — K219 Gastro-esophageal reflux disease without esophagitis: Secondary | ICD-10-CM | POA: Diagnosis not present

## 2020-07-01 DIAGNOSIS — Z885 Allergy status to narcotic agent status: Secondary | ICD-10-CM | POA: Insufficient documentation

## 2020-07-01 DIAGNOSIS — K449 Diaphragmatic hernia without obstruction or gangrene: Secondary | ICD-10-CM | POA: Diagnosis not present

## 2020-07-01 DIAGNOSIS — M419 Scoliosis, unspecified: Secondary | ICD-10-CM | POA: Diagnosis not present

## 2020-07-01 HISTORY — PX: ESOPHAGOGASTRODUODENOSCOPY (EGD) WITH PROPOFOL: SHX5813

## 2020-07-01 LAB — POCT PREGNANCY, URINE: Preg Test, Ur: NEGATIVE

## 2020-07-01 SURGERY — ESOPHAGOGASTRODUODENOSCOPY (EGD) WITH PROPOFOL
Anesthesia: General | Site: Mouth

## 2020-07-01 MED ORDER — LACTATED RINGERS IV SOLN: INTRAVENOUS | Status: DC

## 2020-07-01 MED ORDER — STERILE WATER FOR IRRIGATION IR SOLN: Status: DC | PRN

## 2020-07-01 MED ORDER — LIDOCAINE HCL (CARDIAC) PF 100 MG/5ML IV SOSY
PREFILLED_SYRINGE | INTRAVENOUS | Status: DC | PRN
  Administered 2020-07-01: 50 mg via INTRAVENOUS

## 2020-07-01 MED ORDER — GLYCOPYRROLATE 0.2 MG/ML IJ SOLN
INTRAMUSCULAR | Status: DC | PRN
  Administered 2020-07-01 (×2): .1 mg via INTRAVENOUS

## 2020-07-01 MED ORDER — ACETAMINOPHEN 325 MG PO TABS: 325.0000 mg | ORAL_TABLET | Freq: Once | ORAL | Status: DC

## 2020-07-01 MED ORDER — PROPOFOL 10 MG/ML IV BOLUS
INTRAVENOUS | Status: DC | PRN
  Administered 2020-07-01: 70 mg via INTRAVENOUS
  Administered 2020-07-01: 30 mg via INTRAVENOUS

## 2020-07-01 MED ORDER — ACETAMINOPHEN 160 MG/5ML PO SOLN: 325.0000 mg | Freq: Once | ORAL | Status: DC

## 2020-07-01 SURGICAL SUPPLY — 6 items
BLOCK BITE 60FR ADLT L/F GRN (MISCELLANEOUS) ×3 IMPLANT
GOWN CVR UNV OPN BCK APRN NK (MISCELLANEOUS) ×2 IMPLANT
GOWN ISOL THUMB LOOP REG UNIV (MISCELLANEOUS) ×6
KIT ENDO PROCEDURE OLY (KITS) ×3 IMPLANT
MANIFOLD NEPTUNE II (INSTRUMENTS) ×3 IMPLANT
WATER STERILE IRR 250ML POUR (IV SOLUTION) ×3 IMPLANT

## 2020-07-01 NOTE — Anesthesia Postprocedure Evaluation (Signed)
Anesthesia Post Note  Patient: Kathleen Moses  Procedure(s) Performed: ESOPHAGOGASTRODUODENOSCOPY (EGD) WITH PROPOFOL (N/A Mouth)     Patient location during evaluation: PACU Anesthesia Type: General Level of consciousness: awake and alert and oriented Pain management: satisfactory to patient Vital Signs Assessment: post-procedure vital signs reviewed and stable Respiratory status: spontaneous breathing, nonlabored ventilation and respiratory function stable Cardiovascular status: blood pressure returned to baseline and stable Postop Assessment: Adequate PO intake and No signs of nausea or vomiting Anesthetic complications: no   No complications documented.  Cherly Beach

## 2020-07-01 NOTE — Transfer of Care (Signed)
Immediate Anesthesia Transfer of Care Note  Patient: Kathleen Moses  Procedure(s) Performed: ESOPHAGOGASTRODUODENOSCOPY (EGD) WITH PROPOFOL (N/A Mouth)  Patient Location: PACU  Anesthesia Type: General  Level of Consciousness: awake, alert  and patient cooperative  Airway and Oxygen Therapy: Patient Spontanous Breathing and Patient connected to supplemental oxygen  Post-op Assessment: Post-op Vital signs reviewed, Patient's Cardiovascular Status Stable, Respiratory Function Stable, Patent Airway and No signs of Nausea or vomiting  Post-op Vital Signs: Reviewed and stable  Complications: No complications documented.

## 2020-07-01 NOTE — H&P (Signed)
Midge Minium, MD [facility] [address] [place], [place] [address] Phone:[phone] Fax : [phone]  Primary Care Physician:  Marguarite Arbour, MD Primary Gastroenterologist:  Dr. Servando Snare  Pre-Procedure History & Physical: HPI:  Kathleen Moses is a 54 y.o. female is here for an endoscopy.   Past Medical History:  Diagnosis Date  . Complication of anesthesia    VOMITED X 1 AFTER D&C 2015  . PONV (postoperative nausea and vomiting)    PONV after 1st  D&C  . Scoliosis    MILD-DX IN 6TH GRADE  . Vaginal bleeding, abnormal 2018    Past Surgical History:  Procedure Laterality Date  . COLONOSCOPY WITH PROPOFOL N/A 01/24/2018   Procedure: COLONOSCOPY WITH PROPOFOL;  Surgeon: Midge Minium, MD;  Location: [facility];  Service: Endoscopy;  Laterality: N/A;  . DILATION AND CURETTAGE OF UTERUS    . HYSTEROSCOPY WITH D & C N/A 05/07/2017   Procedure: DILATATION AND CURETTAGE /HYSTEROSCOPY;  Surgeon: [name], MD;  Location: [facility];  Service: Gynecology;  Laterality: N/A;    Prior to Admission medications   Medication Sig Start Date End Date Taking? Authorizing Provider  acetaminophen (TYLENOL) 325 MG tablet Take 650 mg by mouth every 6 (six) hours as needed.   Yes [provider]  cetirizine (ZYRTEC) 10 MG tablet Take 10 mg by mouth daily.   Yes [provider]  cholecalciferol (VITAMIN D) 1000 units tablet Take 1,000 Units by mouth daily.   Yes [provider]  famotidine (PEPCID) 20 MG tablet Take by mouth.   Yes [provider]  Multiple Vitamin (MULTIVITAMIN) capsule Take 1 capsule by mouth daily.   Yes [provider]    Allergies as of 05/08/2020 - Review Complete 05/06/2020  Allergen Reaction Noted  . Codeine Nausea Only 11/22/2017    Family History  Problem Relation Age of Onset  . Lung disease Mother   . Bladder Cancer Father   . Kidney disease Father   . Breast cancer Neg Hx     Social History     Socioeconomic History  . Marital status: Married    Spouse name: Not on file  . Number of children: Not on file  . Years of education: Not on file  . Highest education level: Not on file  Occupational History  . Not on file  Tobacco Use  . Smoking status: Never Smoker  . Smokeless tobacco: Never Used  Vaping Use  . Vaping Use: Never used  Substance and Sexual Activity  . Alcohol use: No  . Drug use: No  . Sexual activity: Yes  Other Topics Concern  . Not on file  Social History Narrative  . Not on file   Social Determinants of Health   Financial Resource Strain:   . Difficulty of Paying Living Expenses: Not on file  Food Insecurity:   . Worried About Programme researcher, broadcasting/film/video in the Last Year: Not on file  . Ran Out of Food in the Last Year: Not on file  Transportation Needs:   . Lack of Transportation (Medical): Not on file  . Lack of Transportation (Non-Medical): Not on file  Physical Activity:   . Days of Exercise per Week: Not on file  . Minutes of Exercise per Session: Not on file  Stress:   . Feeling of Stress : Not on file  Social Connections:   . Frequency of Communication with Friends and Family: Not on file  . Frequency of  Social Gatherings with Friends and Family: Not on file  . Attends Religious Services: Not on file  . Active Member of Clubs or Organizations: Not on file  . Attends Banker Meetings: Not on file  . Marital Status: Not on file  Intimate Partner Violence:   . Fear of Current or Ex-Partner: Not on file  . Emotionally Abused: Not on file  . Physically Abused: Not on file  . Sexually Abused: Not on file    Review of Systems: See HPI, otherwise negative ROS  Physical Exam: BP 123/74   Pulse 66   Temp (!) 97.3 F (36.3 C) (Temporal)   Resp 18   Ht 5\' 2"  (1.575 m)   Wt 59.6 kg   LMP 02/08/2020 (Approximate)   BMI 24.02 kg/m  General:   Alert,  pleasant and cooperative in NAD Head:  Normocephalic and atraumatic. Neck:   Supple; no masses or thyromegaly. Lungs:  Clear throughout to auscultation.    Heart:  Regular rate and rhythm. Abdomen:  Soft, nontender and nondistended. Normal bowel sounds, without guarding, and without rebound.   Neurologic:  Alert and  oriented x4;  grossly normal neurologically.  Impression/Plan: Kathleen Moses is here for an endoscopy to be performed for gerd  Risks, benefits, limitations, and alternatives regarding  endoscopy have been reviewed with the patient.  Questions have been answered.  All parties agreeable.   Youlanda Mighty, MD  07/01/2020, 8:06 AM

## 2020-07-01 NOTE — Anesthesia Preprocedure Evaluation (Signed)
Anesthesia Evaluation  Patient identified by MRN, date of birth, ID band Patient awake    Reviewed: Allergy & Precautions, H&P , NPO status , Patient's Chart, lab work & pertinent test results  Airway Mallampati: II  TM Distance: >3 FB Neck ROM: full    Dental no notable dental hx.    Pulmonary    Pulmonary exam normal breath sounds clear to auscultation       Cardiovascular Normal cardiovascular exam Rhythm:regular Rate:Normal     Neuro/Psych PSYCHIATRIC DISORDERS    GI/Hepatic GERD  ,  Endo/Other    Renal/GU      Musculoskeletal   Abdominal   Peds  Hematology   Anesthesia Other Findings   Reproductive/Obstetrics                             Anesthesia Physical Anesthesia Plan  ASA: II  Anesthesia Plan: General   Post-op Pain Management:    Induction: Intravenous  PONV Risk Score and Plan: 3 and Treatment may vary due to age or medical condition, TIVA and Propofol infusion  Airway Management Planned: Natural Airway  Additional Equipment:   Intra-op Plan:   Post-operative Plan:   Informed Consent: I have reviewed the patients History and Physical, chart, labs and discussed the procedure including the risks, benefits and alternatives for the proposed anesthesia with the patient or authorized representative who has indicated his/her understanding and acceptance.     Dental Advisory Given  Plan Discussed with: CRNA  Anesthesia Plan Comments:         Anesthesia Quick Evaluation

## 2020-07-01 NOTE — Anesthesia Procedure Notes (Signed)
Procedure Name: MAC Date/Time: 07/01/2020 8:15 AM Performed by: Vanetta Shawl, CRNA Pre-anesthesia Checklist: Patient identified, Emergency Drugs available, Suction available, Timeout performed and Patient being monitored Patient Re-evaluated:Patient Re-evaluated prior to induction Oxygen Delivery Method: Nasal cannula Placement Confirmation: positive ETCO2

## 2020-07-01 NOTE — Op Note (Signed)
[facility] Gastroenterology Patient Name: Mele Sylvester Procedure Date: 07/01/2020 8:09 AM MRN: [id number] Account #: [id number] Date of Birth: [birth year] Admit Type: Outpatient Age: 54 Room: [facility] OR ROOM 01 Gender: Female Note Status: Finalized Procedure:             Upper GI endoscopy Indications:           Heartburn Providers:             Midge Minium MD, MD Referring MD:          Duane Lope. Judithann Sheen, MD (Referring MD) Medicines:             Propofol per Anesthesia Complications:         No immediate complications. Procedure:             Pre-Anesthesia Assessment:                        - Prior to the procedure, a History and Physical was                         performed, and patient medications and allergies were                         reviewed. The patient's tolerance of previous                         anesthesia was also reviewed. The risks and benefits                         of the procedure and the sedation options and risks                         were discussed with the patient. All questions were                         answered, and informed consent was obtained. Prior                         Anticoagulants: The patient has taken no previous                         anticoagulant or antiplatelet agents. ASA Grade                         Assessment: II - A patient with mild systemic disease.                         After reviewing the risks and benefits, the patient                         was deemed in satisfactory condition to undergo the                         procedure.                        After obtaining informed consent, the endoscope was  passed under direct vision. Throughout the procedure,                         the patient's blood pressure, pulse, and oxygen                         saturations were monitored continuously. The was                         introduced through the mouth, and advanced to the                          second part of duodenum. The upper GI endoscopy was                         accomplished without difficulty. The patient tolerated                         the procedure well. Findings:      A small hiatal hernia was present.      The entire examined stomach was normal.      The examined duodenum was normal. Impression:            - Small hiatal hernia.                        - Normal stomach.                        - Normal examined duodenum.                        - No specimens collected. Recommendation:        - Discharge patient to home.                        - Resume previous diet.                        - Continue present medications. Procedure Code(s):     --- Professional ---                        [phone], Esophagogastroduodenoscopy, flexible,                         transoral; diagnostic, including collection of                         specimen(s) by brushing or washing, when performed                         (separate procedure) Diagnosis Code(s):     --- Professional ---                        R12, Heartburn CPT copyright 2019 American Medical Association. All rights reserved. The codes documented in this report are preliminary and upon coder review may  be revised to meet current compliance requirements. Midge Minium MD, MD 07/01/2020 8:21:29 AM This report has been signed electronically. Number of Addenda: 0 Note Initiated On: 07/01/2020 8:09 AM Total Procedure Duration: 0 hours  2 minutes 15 seconds  Estimated Blood Loss:  Estimated blood loss: none.      [facility]

## 2020-07-02 ENCOUNTER — Encounter: Payer: Self-pay | Admitting: Gastroenterology

## 2020-12-16 ENCOUNTER — Ambulatory Visit: Payer: BC Managed Care – PPO | Admitting: Obstetrics and Gynecology

## 2020-12-18 ENCOUNTER — Other Ambulatory Visit: Payer: Self-pay | Admitting: Obstetrics and Gynecology

## 2020-12-18 DIAGNOSIS — Z1231 Encounter for screening mammogram for malignant neoplasm of breast: Secondary | ICD-10-CM

## 2020-12-23 ENCOUNTER — Other Ambulatory Visit: Payer: Self-pay

## 2020-12-23 ENCOUNTER — Encounter: Payer: Self-pay | Admitting: Obstetrics and Gynecology

## 2020-12-23 ENCOUNTER — Ambulatory Visit (INDEPENDENT_AMBULATORY_CARE_PROVIDER_SITE_OTHER): Payer: BC Managed Care – PPO | Admitting: Obstetrics and Gynecology

## 2020-12-23 VITALS — BP 126/82 | Ht 62.0 in | Wt 128.0 lb

## 2020-12-23 DIAGNOSIS — Z1231 Encounter for screening mammogram for malignant neoplasm of breast: Secondary | ICD-10-CM | POA: Diagnosis not present

## 2020-12-23 DIAGNOSIS — N939 Abnormal uterine and vaginal bleeding, unspecified: Secondary | ICD-10-CM

## 2020-12-23 DIAGNOSIS — Z01419 Encounter for gynecological examination (general) (routine) without abnormal findings: Secondary | ICD-10-CM

## 2020-12-23 NOTE — Progress Notes (Unsigned)
Gynecology Annual Exam  PCP: Marguarite Arbour, MD  Chief Complaint:  Chief Complaint  Patient presents with  . Gynecologic Exam    Annual - on and off bleeding x 1 month. RM 5    History of Present Illness:Patient is a 55 y.o. G0P0000 presents for annual exam. The patient has no complaints today.   LMP: No LMP recorded. Patient is perimenopausal. Still having some irregular menstrual cycles but at irregular intervals.  No intermenstrual spotting.  Does have previously documented uterine fibroid and status post prior hysteroscopy D&C.  The patient is sexually active. She denies dyspareunia.  The patient does perform self breast exams.  There is no notable family history of breast or ovarian cancer in her family.  The patient wears seatbelts: yes.   The patient has regular exercise: not asked.    The patient denies current symptoms of depression.     Review of Systems: Review of Systems  Constitutional: Negative for chills and fever.  HENT: Negative for congestion.   Respiratory: Negative for cough and shortness of breath.   Cardiovascular: Negative for chest pain and palpitations.  Gastrointestinal: Negative for abdominal pain, constipation, diarrhea, heartburn, nausea and vomiting.  Genitourinary: Negative for dysuria, frequency and urgency.  Skin: Negative for itching and rash.  Neurological: Negative for dizziness and headaches.  Endo/Heme/Allergies: Negative for polydipsia.  Psychiatric/Behavioral: Negative for depression.    Past Medical History:  Patient Active Problem List   Diagnosis Date Noted  . Heartburn   . Blood in urine 05/06/2020  . Gastroesophageal reflux disease without esophagitis 02/02/2020  . Dyspepsia 11/08/2019  . GAD (generalized anxiety disorder) 11/08/2019  . Pain in throat 11/08/2019  . Colon cancer screening   . Juvenile idiopathic scoliosis of thoracolumbar region 11/22/2017  . Pure hypercholesterolemia 11/22/2017  . Vitamin D  deficiency 11/22/2017    Past Surgical History:  Past Surgical History:  Procedure Laterality Date  . COLONOSCOPY WITH PROPOFOL N/A 01/24/2018   Procedure: COLONOSCOPY WITH PROPOFOL;  Surgeon: Midge Minium, MD;  Location: [facility];  Service: Endoscopy;  Laterality: N/A;  . DILATION AND CURETTAGE OF UTERUS    . ESOPHAGOGASTRODUODENOSCOPY (EGD) WITH PROPOFOL N/A 07/01/2020   Procedure: ESOPHAGOGASTRODUODENOSCOPY (EGD) WITH PROPOFOL;  Surgeon: Midge Minium, MD;  Location: [facility];  Service: Endoscopy;  Laterality: N/A;  . HYSTEROSCOPY WITH D & C N/A 05/07/2017   Procedure: DILATATION AND CURETTAGE /HYSTEROSCOPY;  Surgeon: [name], MD;  Location: [facility];  Service: Gynecology;  Laterality: N/A;    Gynecologic History:  No LMP recorded. Patient is perimenopausal. Last Pap: Results were: 12/15/2019 NIL and HR HPV negative  Endometrial Bx: 05/07/2017 proliferative phase endometrium no evidence of hyperplasia or malignancy Last mammogram: 12/15/2019 Results were: BI-RAD I  Obstetric History: G0P0000  Family History:  Family History  Problem Relation Age of Onset  . Lung disease Mother   . Cancer Mother   . Bladder Cancer Father   . Kidney disease Father   . Diabetes Father   . Breast cancer Neg Hx     Social History:  Social History   Socioeconomic History  . Marital status: Married    Spouse name: Not on file  . Number of children: Not on file  . Years of education: Not on file  . Highest education level: Not on file  Occupational History  . Not on file  Tobacco Use  . Smoking status: Never Smoker  . Smokeless tobacco: Never Used  Vaping Use  .  Vaping Use: Never used  Substance and Sexual Activity  . Alcohol use: No  . Drug use: No  . Sexual activity: Yes    Birth control/protection: None  Other Topics Concern  . Not on file  Social History Narrative  . Not on file   Social Determinants of Health   Financial Resource Strain: Not on file   Food Insecurity: Not on file  Transportation Needs: Not on file  Physical Activity: Not on file  Stress: Not on file  Social Connections: Not on file  Intimate Partner Violence: Not on file    Allergies:  Allergies  Allergen Reactions  . Codeine Nausea Only    Medications: Prior to Admission medications   Medication Sig Start Date End Date Taking? Authorizing Provider  acetaminophen (TYLENOL) 325 MG tablet Take 650 mg by mouth every 6 (six) hours as needed.    [provider]  cetirizine (ZYRTEC) 10 MG tablet Take 10 mg by mouth daily.    [provider]  cholecalciferol (VITAMIN D) 1000 units tablet Take 1,000 Units by mouth daily.    [provider]  famotidine (PEPCID) 20 MG tablet Take by mouth.    [provider]  Multiple Vitamin (MULTIVITAMIN) capsule Take 1 capsule by mouth daily.    [provider]    Physical Exam Vitals: Blood pressure 126/82, height 5\' 2"  (1.575 m), weight 128 lb (58.1 kg).   General: NAD HEENT: normocephalic, anicteric Thyroid: no enlargement, no palpable nodules Pulmonary: No increased work of breathing, CTAB Cardiovascular: RRR, distal pulses 2+ Breast: Breast symmetrical, no tenderness, no palpable nodules or masses, no skin or nipple retraction present, no nipple discharge.  No axillary or supraclavicular lymphadenopathy. Abdomen: NABS, soft, non-tender, non-distended.  Umbilicus without lesions.  No hepatomegaly, splenomegaly or masses palpable. No evidence of hernia  Genitourinary:  External: Normal external female genitalia.  Normal urethral meatus, normal Bartholin's and Skene's glands.    Vagina: Normal vaginal mucosa, no evidence of prolapse.    Cervix: Grossly normal in appearance, no bleeding  Uterus: Non-enlarged, mobile, normal contour.  No CMT  Adnexa: ovaries non-enlarged, no adnexal masses  Rectal: deferred  Lymphatic: no evidence of inguinal lymphadenopathy Extremities: no edema,  erythema, or tenderness Neurologic: Grossly intact Psychiatric: mood appropriate, affect full  Female chaperone present for pelvic and breast  portions of the physical exam  Immunization History  Administered Date(s) Administered  . Influenza Inj Mdck Quad Pf 08/20/2019  . Influenza, Quadrivalent, Recombinant, Inj, Pf 10/05/2018  . Influenza-Unspecified 10/05/2018, 08/20/2019      Assessment: 55 y.o. G0P0000 routine annual exam  Plan: Problem List Items Addressed This Visit   None   Visit Diagnoses    Encounter for gynecological examination without abnormal finding    -  Primary   Relevant Orders   40 Transvaginal Non-OB   Breast cancer screening by mammogram       Relevant Orders   MM 3D SCREEN BREAST BILATERAL   Abnormal uterine bleeding       Relevant Orders   US Transvaginal Non-OB      1) Mammogram - recommend yearly screening mammogram.  Mammogram Was ordered today  2) STI screening  was notoffered and therefore not obtained  3) ASCCP guidelines and rational discussed.  Patient opts for every 3 years screening interval  4) Osteoporosis  - per USPTF routine screening DEXA at age 13  5) Routine healthcare maintenance including cholesterol, diabetes screening discussed managed by PCP  6) Colonoscopy 01/24/2018  normal  7) AUB - likely perimenopausal given history of uterine fibroid will obtain TVUS to evaluate size  8) Return in about 1 week (around 12/30/2020) for TVUS right lower quadrant pain and AUB.    [name], MD Domingo Pulse, [facility] 12/23/2020, 2:49 PM

## 2020-12-23 NOTE — Patient Instructions (Signed)
[facility] [address] [place] [address]  [facility] [place]  [address] [place] [address]  Phone: [phone]  

## 2021-01-07 ENCOUNTER — Other Ambulatory Visit: Payer: Self-pay

## 2021-01-07 ENCOUNTER — Ambulatory Visit
Admission: RE | Admit: 2021-01-07 | Discharge: 2021-01-07 | Disposition: A | Discharge status: Home / Self Care | Payer: BC Managed Care – PPO | Source: Ambulatory Visit | Attending: Obstetrics and Gynecology | Admitting: Obstetrics and Gynecology

## 2021-01-07 DIAGNOSIS — Z1231 Encounter for screening mammogram for malignant neoplasm of breast: Secondary | ICD-10-CM | POA: Insufficient documentation

## 2021-01-10 ENCOUNTER — Ambulatory Visit: Payer: BC Managed Care – PPO

## 2021-01-10 ENCOUNTER — Ambulatory Visit: Payer: BC Managed Care – PPO | Admitting: Obstetrics and Gynecology

## 2021-01-10 DIAGNOSIS — Z01419 Encounter for gynecological examination (general) (routine) without abnormal findings: Secondary | ICD-10-CM

## 2021-01-10 DIAGNOSIS — N939 Abnormal uterine and vaginal bleeding, unspecified: Secondary | ICD-10-CM

## 2021-01-21 ENCOUNTER — Other Ambulatory Visit: Payer: Self-pay

## 2021-01-21 ENCOUNTER — Ambulatory Visit (INDEPENDENT_AMBULATORY_CARE_PROVIDER_SITE_OTHER): Payer: BC Managed Care – PPO | Admitting: Obstetrics and Gynecology

## 2021-01-21 ENCOUNTER — Encounter: Payer: Self-pay | Admitting: Obstetrics and Gynecology

## 2021-01-21 ENCOUNTER — Other Ambulatory Visit (HOSPITAL_COMMUNITY)
Admission: RE | Admit: 2021-01-21 | Discharge: 2021-01-21 | Disposition: A | Discharge status: Home / Self Care | Payer: BC Managed Care – PPO | Source: Ambulatory Visit | Attending: Obstetrics and Gynecology | Admitting: Obstetrics and Gynecology

## 2021-01-21 ENCOUNTER — Ambulatory Visit (INDEPENDENT_AMBULATORY_CARE_PROVIDER_SITE_OTHER): Payer: BC Managed Care – PPO

## 2021-01-21 VITALS — BP 122/74 | Ht 62.0 in | Wt 126.0 lb

## 2021-01-21 DIAGNOSIS — R9389 Abnormal findings on diagnostic imaging of other specified body structures: Secondary | ICD-10-CM

## 2021-01-21 DIAGNOSIS — N939 Abnormal uterine and vaginal bleeding, unspecified: Secondary | ICD-10-CM | POA: Insufficient documentation

## 2021-01-21 DIAGNOSIS — Z01419 Encounter for gynecological examination (general) (routine) without abnormal findings: Secondary | ICD-10-CM | POA: Diagnosis not present

## 2021-01-21 NOTE — Progress Notes (Signed)
Gynecology Ultrasound Follow Up  Chief Complaint:  Chief Complaint  Patient presents with  . Follow-up    F/U TVUS - RM 4     History of Present Illness: Patient is a 55 y.o. female who presents today for ultrasound evaluation of pelvic pain.  Ultrasound demonstrates the following findgins Adnexa: 2.7cm simple ovarian cyst stable from prior imaging Uterus: Non-enlarged with small stable fibroid with endometrial stripe thickened and heterogenous at 13.26mm Additional: no free fluid  Review of Systems: Review of Systems  Constitutional: Negative.   Gastrointestinal: Negative.   Genitourinary: Negative.     Past Medical History:  Past Medical History:  Diagnosis Date  . Complication of anesthesia    VOMITED X 1 AFTER D&C 2015  . PONV (postoperative nausea and vomiting)    PONV after 1st  D&C  . Scoliosis    MILD-DX IN 6TH GRADE  . Vaginal bleeding, abnormal 2018    Past Surgical History:  Past Surgical History:  Procedure Laterality Date  . COLONOSCOPY WITH PROPOFOL N/A 01/24/2018   Procedure: COLONOSCOPY WITH PROPOFOL;  Surgeon: Midge Minium, MD;  Location: [facility];  Service: Endoscopy;  Laterality: N/A;  . DILATION AND CURETTAGE OF UTERUS    . ESOPHAGOGASTRODUODENOSCOPY (EGD) WITH PROPOFOL N/A 07/01/2020   Procedure: ESOPHAGOGASTRODUODENOSCOPY (EGD) WITH PROPOFOL;  Surgeon: Midge Minium, MD;  Location: [facility];  Service: Endoscopy;  Laterality: N/A;  . HYSTEROSCOPY WITH D & C N/A 05/07/2017   Procedure: DILATATION AND CURETTAGE /HYSTEROSCOPY;  Surgeon: [name], MD;  Location: [facility];  Service: Gynecology;  Laterality: N/A;    Gynecologic History:  No LMP recorded. Patient is perimenopausal.  Family History:  Family History  Problem Relation Age of Onset  . Lung disease Mother   . Cancer Mother   . Bladder Cancer Father   . Kidney disease Father   . Diabetes Father   . Breast cancer Neg Hx     Social History:  Social  History   Socioeconomic History  . Marital status: Married    Spouse name: Not on file  . Number of children: Not on file  . Years of education: Not on file  . Highest education level: Not on file  Occupational History  . Not on file  Tobacco Use  . Smoking status: Never Smoker  . Smokeless tobacco: Never Used  Vaping Use  . Vaping Use: Never used  Substance and Sexual Activity  . Alcohol use: No  . Drug use: No  . Sexual activity: Yes    Birth control/protection: None  Other Topics Concern  . Not on file  Social History Narrative  . Not on file   Social Determinants of Health   Financial Resource Strain: Not on file  Food Insecurity: Not on file  Transportation Needs: Not on file  Physical Activity: Not on file  Stress: Not on file  Social Connections: Not on file  Intimate Partner Violence: Not on file    Allergies:  Allergies  Allergen Reactions  . Codeine Nausea Only    Medications: Prior to Admission medications   Medication Sig Start Date End Date Taking? Authorizing Provider  cetirizine (ZYRTEC) 10 MG tablet Take 10 mg by mouth daily.   Yes [provider]  cholecalciferol (VITAMIN D) 1000 units tablet Take 1,000 Units by mouth daily.   Yes [provider]  Multiple Vitamin (MULTIVITAMIN) capsule Take 1 capsule by mouth daily.   Yes [provider]  acetaminophen (TYLENOL) 325  MG tablet Take 650 mg by mouth every 6 (six) hours as needed.    [provider]  famotidine (PEPCID) 20 MG tablet Take by mouth.    [provider]  ibuprofen (ADVIL) 200 MG tablet Take by mouth.    [provider]    Physical Exam Vitals: Blood pressure 122/74, height 5\' 2"  (1.575 m), weight 126 lb (57.2 kg).  General: NAD HEENT: normocephalic, anicteric Pulmonary: No increased work of breathing Extremities: no edema, erythema, or tenderness Neurologic: Grossly intact, normal gait Psychiatric: mood appropriate, affect  full    ENDOMETRIAL BIOPSY     The indications for endometrial biopsy were reviewed.   Risks of the biopsy including cramping, bleeding, infection, uterine perforation, inadequate specimen and need for additional procedures  were discussed. The patient states she understands and agrees to undergo procedure today. Consent was signed. Time out was performed. Urine HCG was negative. A Graves speculum was placed and the cervix was brought into view.  The cervix was prepped with Betadine. A single-toothed tenaculum was  placed on the anterior lip of the cervix for traction. A 3 mm pipelle was introduced through the cervix into the endometrial cavity without difficulty to a depth of 8cm, and a small amount of tissue was obtained, the resulting specime sent to pathology. The instruments were removed from the patient's vagina. Minimal bleeding from the cervix was noted. The patient tolerated the procedure well. Routine post-procedure instructions were given to the patient.  She will be contacted by phone one results become available.     Transvaginal Non-OB  Result Date: 01/21/2021 Patient Name: Kathleen Moses DOB: 04-Dec-1965 MRN: [id number] ULTRASOUND REPORT Location: [facility] Date of Service: 01/21/2021 Indications:Abnormal Uterine Bleeding Findings: The uterus is anteverted and measures 8.5 x 4.6 x 4.3cm. Echo texture is heterogenous with evidence of focal masses. Within the uterus are multiple suspected fibroids measuring: Fibroid 1:  1.3 x 1.2 x 0.9cm (posterior, IM) unchanged Fibroid 2:  2.3 x 2.7 x 1.8cm (LT/SUP, SS) fibroid vs other. The Endometrium measures 13.1 mm. Appears thickened and heterogeneous Right Ovary measures 1.9 x 1.5 x 1.4 cm with dominant follicle and paraovarian cyst Left Ovary measures 3.1 x 2.4 x 2.0 cm with simple cyst 2.7cm Survey of the adnexa demonstrates no adnexal masses. There is no free fluid in the cul de sac. Impression: 1. Single fibroid again seen that is unchanged. 2.  New mass vs fibroid seen that is new this exam. 3. Thickened and heterogeneous endometrium. 4. Left ovarian cyst unchanged.  Recommendations: 1.Clinical correlation with the patient's History and Physical Exam. 01/23/2021, RT Images reviewed.  1) Stable uterine fibroid 2) Thickened endometrium at 13.45mm.  An endometrial measurement greater than 4 mm that is incidentally discovered in a postmenopausal patient without bleeding need not routinely trigger evaluation, although an individualized assessment based on patient characteristics and risk factors is appropriate. Thus, transvaginal ultrasonography is not an appropriate screening tool for endometrial cancer in postmenopausal women without bleeding.  ACOG Committee Opinion 734, May 2018 "The Role of Transvaginal Ultrasonography in Evaluating the Endometrium of Women with Postmenopausal Bleeding" 3) Staple left ovarian simple cyst.   1. Cysts ?1 cm: Are clinically inconsequential; at the discretion of the interpreting physician whether or not to describe them in the imaging report; do not need follow-up. 2. Cysts >1 and ?7 cm: Should be described in the imaging report with statement that they are almost certainly benign; yearly follow-up, at least initially, with  [place] recommended. Some practices may opt to increase the lower size threshold for follow-up from 1 cm to as high as 3 cm. One may opt to continue follow-up annually or to decrease the frequency of follow-up once stability or decrease in size has been confirmed. Cysts in the larger end of this range should still generally be followed on a regular basis. 3. Cysts >7 cm: Since these may be difficult to assess completely with [place], further imaging with MR or surgical evaluation should be considered. Grier Rocher et al. Management of Asymptomatic Ovarian and Other Adnexal Cysts Imaged at [place]: Society of Radiologists in Ultrasound Consensus Conference Statement 2010. Radiology 256 (Sept 2010): 943-954.). [name], MD, [facility], [facility] 01/21/2021, 4:32 PM  MM 3D SCREEN BREAST BILATERAL  Result Date: 01/08/2021 CLINICAL DATA:  Screening. EXAM: DIGITAL SCREENING BILATERAL MAMMOGRAM WITH TOMOSYNTHESIS AND CAD TECHNIQUE: Bilateral screening digital craniocaudal and mediolateral oblique mammograms were obtained. Bilateral screening digital breast tomosynthesis was performed. The images were evaluated with computer-aided detection. COMPARISON:  Previous exam(s). ACR Breast Density Category b: There are scattered areas of fibroglandular density. FINDINGS: There are no findings suspicious for malignancy. The images were evaluated with computer-aided detection. IMPRESSION: No mammographic evidence of malignancy. A result letter of this screening mammogram will be mailed directly to the patient. RECOMMENDATION: Screening mammogram in one year. (Code:SM-B-01Y) BI-RADS CATEGORY  1: Negative. Electronically Signed   By: Ted Mcalpine M.D.   On: 01/08/2021 17:12    Assessment: 55 y.o. G0P0000  Plan: Problem List Items Addressed This Visit   None   Visit Diagnoses    Abnormal uterine bleeding    -  Primary   Relevant Orders   Surgical pathology   Thickened endometrium       Relevant Orders   Surgical pathology      1) Pelvic pain - ultrasound no gyn etiologies identified, staple left ovarian cyst from prior imaging  2) Thickened endometrium - patient is still having occasional intermittent periods.  Does report infrequent but intermenstrual bleeding in past on questioning so endometrial biopsy obtained.    3) A total of 15 minutes were spent in face-to-face contact with the patient during this encounter with over half of that time devoted to counseling and coordination of care.  4) Return if symptoms worsen or fail to improve.    [name], MD, [facility], [facility] 01/21/2021, 4:52 PM

## 2021-01-29 LAB — SURGICAL PATHOLOGY

## 2021-03-19 ENCOUNTER — Other Ambulatory Visit: Payer: Self-pay | Admitting: Orthopedic Surgery

## 2021-03-19 MED ORDER — CEFAZOLIN SODIUM-DEXTROSE 2-4 GM/100ML-% IV SOLN
2.0000 g | INTRAVENOUS | Status: AC
  Administered 2021-03-20: 2 g via INTRAVENOUS

## 2021-03-20 ENCOUNTER — Ambulatory Visit
Admission: RE | Admit: 2021-03-20 | Discharge: 2021-03-20 | Disposition: A | Discharge status: Home / Self Care | Payer: Worker's Compensation | Attending: Orthopedic Surgery | Admitting: Orthopedic Surgery

## 2021-03-20 ENCOUNTER — Encounter
Admission: RE | Disposition: A | Discharge status: Home / Self Care | Payer: Self-pay | Source: Home / Self Care | Attending: Orthopedic Surgery

## 2021-03-20 ENCOUNTER — Other Ambulatory Visit: Payer: Self-pay

## 2021-03-20 ENCOUNTER — Ambulatory Visit: Payer: Worker's Compensation | Attending: Orthopedic Surgery

## 2021-03-20 ENCOUNTER — Ambulatory Visit: Payer: Worker's Compensation | Admitting: Certified Registered Nurse Anesthetist

## 2021-03-20 ENCOUNTER — Encounter: Payer: Self-pay | Admitting: Orthopedic Surgery

## 2021-03-20 ENCOUNTER — Ambulatory Visit: Payer: Worker's Compensation

## 2021-03-20 DIAGNOSIS — Z96698 Presence of other orthopedic joint implants: Secondary | ICD-10-CM | POA: Diagnosis not present

## 2021-03-20 DIAGNOSIS — S59201A Unspecified physeal fracture of lower end of radius, right arm, initial encounter for closed fracture: Secondary | ICD-10-CM | POA: Diagnosis not present

## 2021-03-20 DIAGNOSIS — Z9889 Other specified postprocedural states: Secondary | ICD-10-CM | POA: Insufficient documentation

## 2021-03-20 DIAGNOSIS — Y990 Civilian activity done for income or pay: Secondary | ICD-10-CM | POA: Insufficient documentation

## 2021-03-20 DIAGNOSIS — Z8781 Personal history of (healed) traumatic fracture: Secondary | ICD-10-CM | POA: Diagnosis not present

## 2021-03-20 DIAGNOSIS — S52501A Unspecified fracture of the lower end of right radius, initial encounter for closed fracture: Secondary | ICD-10-CM | POA: Insufficient documentation

## 2021-03-20 DIAGNOSIS — Z791 Long term (current) use of non-steroidal anti-inflammatories (NSAID): Secondary | ICD-10-CM | POA: Diagnosis not present

## 2021-03-20 DIAGNOSIS — W010XXA Fall on same level from slipping, tripping and stumbling without subsequent striking against object, initial encounter: Secondary | ICD-10-CM | POA: Insufficient documentation

## 2021-03-20 DIAGNOSIS — Z885 Allergy status to narcotic agent status: Secondary | ICD-10-CM | POA: Insufficient documentation

## 2021-03-20 HISTORY — PX: OPEN REDUCTION INTERNAL FIXATION (ORIF) DISTAL RADIAL FRACTURE: SHX5989

## 2021-03-20 LAB — POCT PREGNANCY, URINE: Preg Test, Ur: NEGATIVE

## 2021-03-20 SURGERY — OPEN REDUCTION INTERNAL FIXATION (ORIF) DISTAL RADIUS FRACTURE
Anesthesia: General | Site: Wrist | Laterality: Right

## 2021-03-20 MED ORDER — DEXAMETHASONE SODIUM PHOSPHATE 10 MG/ML IJ SOLN
INTRAMUSCULAR | Status: DC | PRN
  Administered 2021-03-20: 5 mg via INTRAVENOUS

## 2021-03-20 MED ORDER — CHLORHEXIDINE GLUCONATE 0.12 % MT SOLN: 15.0000 mL | Freq: Once | OROMUCOSAL | Status: AC

## 2021-03-20 MED ORDER — PROPOFOL 10 MG/ML IV BOLUS
INTRAVENOUS | Status: AC
  Filled 2021-03-20: qty 60

## 2021-03-20 MED ORDER — PHENYLEPHRINE HCL (PRESSORS) 10 MG/ML IV SOLN
INTRAVENOUS | Status: DC | PRN
  Administered 2021-03-20: 200 ug via INTRAVENOUS

## 2021-03-20 MED ORDER — FENTANYL CITRATE (PF) 100 MCG/2ML IJ SOLN
INTRAMUSCULAR | Status: AC
  Administered 2021-03-20: 25 ug via INTRAVENOUS
  Filled 2021-03-20: qty 2

## 2021-03-20 MED ORDER — ORAL CARE MOUTH RINSE: 15.0000 mL | Freq: Once | OROMUCOSAL | Status: AC

## 2021-03-20 MED ORDER — ONDANSETRON HCL 4 MG/2ML IJ SOLN: 4.0000 mg | Freq: Four times a day (QID) | INTRAMUSCULAR | Status: DC | PRN

## 2021-03-20 MED ORDER — SCOPOLAMINE 1 MG/3DAYS TD PT72
1.0000 | MEDICATED_PATCH | Freq: Once | TRANSDERMAL | Status: DC
  Administered 2021-03-20: 1.5 mg via TRANSDERMAL

## 2021-03-20 MED ORDER — ONDANSETRON HCL 4 MG PO TABS: 4.0000 mg | ORAL_TABLET | Freq: Four times a day (QID) | ORAL | Status: DC | PRN

## 2021-03-20 MED ORDER — LIDOCAINE HCL (PF) 2 % IJ SOLN
INTRAMUSCULAR | Status: AC
  Filled 2021-03-20: qty 10

## 2021-03-20 MED ORDER — METOCLOPRAMIDE HCL 10 MG PO TABS: 5.0000 mg | ORAL_TABLET | Freq: Three times a day (TID) | ORAL | Status: DC | PRN

## 2021-03-20 MED ORDER — ONDANSETRON HCL 4 MG/2ML IJ SOLN
INTRAMUSCULAR | Status: DC | PRN
  Administered 2021-03-20: 4 mg via INTRAVENOUS

## 2021-03-20 MED ORDER — LIDOCAINE HCL (CARDIAC) PF 100 MG/5ML IV SOSY
PREFILLED_SYRINGE | INTRAVENOUS | Status: DC | PRN
  Administered 2021-03-20: 40 mg via INTRAVENOUS

## 2021-03-20 MED ORDER — SCOPOLAMINE 1 MG/3DAYS TD PT72
MEDICATED_PATCH | TRANSDERMAL | Status: AC
  Filled 2021-03-20: qty 1

## 2021-03-20 MED ORDER — LACTATED RINGERS IV SOLN: INTRAVENOUS | Status: DC

## 2021-03-20 MED ORDER — CHLORHEXIDINE GLUCONATE 0.12 % MT SOLN
OROMUCOSAL | Status: AC
  Administered 2021-03-20: 15 mL via OROMUCOSAL
  Filled 2021-03-20: qty 15

## 2021-03-20 MED ORDER — ONDANSETRON HCL 4 MG/2ML IJ SOLN: 4.0000 mg | Freq: Once | INTRAMUSCULAR | Status: DC | PRN

## 2021-03-20 MED ORDER — FENTANYL CITRATE (PF) 100 MCG/2ML IJ SOLN
INTRAMUSCULAR | Status: DC | PRN
  Administered 2021-03-20 (×2): 25 ug via INTRAVENOUS

## 2021-03-20 MED ORDER — SODIUM CHLORIDE 0.9 % IV SOLN: INTRAVENOUS | Status: DC

## 2021-03-20 MED ORDER — DEXAMETHASONE SODIUM PHOSPHATE 10 MG/ML IJ SOLN
INTRAMUSCULAR | Status: AC
  Filled 2021-03-20: qty 1

## 2021-03-20 MED ORDER — LIDOCAINE HCL (PF) 2 % IJ SOLN
INTRAMUSCULAR | Status: AC
  Filled 2021-03-20: qty 2

## 2021-03-20 MED ORDER — FENTANYL CITRATE (PF) 100 MCG/2ML IJ SOLN
25.0000 ug | INTRAMUSCULAR | Status: DC | PRN
  Administered 2021-03-20 (×4): 25 ug via INTRAVENOUS

## 2021-03-20 MED ORDER — METOCLOPRAMIDE HCL 5 MG/ML IJ SOLN: 5.0000 mg | Freq: Three times a day (TID) | INTRAMUSCULAR | Status: DC | PRN

## 2021-03-20 MED ORDER — EPHEDRINE SULFATE 50 MG/ML IJ SOLN
INTRAMUSCULAR | Status: DC | PRN
  Administered 2021-03-20: 5 mg via INTRAVENOUS

## 2021-03-20 MED ORDER — ONDANSETRON HCL 4 MG/2ML IJ SOLN
INTRAMUSCULAR | Status: AC
  Filled 2021-03-20: qty 2

## 2021-03-20 MED ORDER — PROPOFOL 10 MG/ML IV BOLUS
INTRAVENOUS | Status: DC | PRN
  Administered 2021-03-20: 40 mg via INTRAVENOUS
  Administered 2021-03-20: 100 mg via INTRAVENOUS

## 2021-03-20 MED ORDER — MIDAZOLAM HCL 2 MG/2ML IJ SOLN
INTRAMUSCULAR | Status: AC
  Filled 2021-03-20: qty 2

## 2021-03-20 MED ORDER — MIDAZOLAM HCL 2 MG/2ML IJ SOLN
INTRAMUSCULAR | Status: DC | PRN
  Administered 2021-03-20: 2 mg via INTRAVENOUS

## 2021-03-20 MED ORDER — CEFAZOLIN SODIUM-DEXTROSE 2-4 GM/100ML-% IV SOLN
INTRAVENOUS | Status: AC
  Filled 2021-03-20: qty 100

## 2021-03-20 MED ORDER — NEOMYCIN-POLYMYXIN B GU 40-200000 IR SOLN
Status: DC | PRN
  Administered 2021-03-20: 2 mL

## 2021-03-20 MED ORDER — FENTANYL CITRATE (PF) 100 MCG/2ML IJ SOLN
INTRAMUSCULAR | Status: AC
  Filled 2021-03-20: qty 2

## 2021-03-20 MED ORDER — HYDROCODONE-ACETAMINOPHEN 5-325 MG PO TABS: 1.0000 | ORAL_TABLET | Freq: Four times a day (QID) | ORAL | 0 refills | Status: DC | PRN

## 2021-03-20 MED ORDER — PROPOFOL 500 MG/50ML IV EMUL
INTRAVENOUS | Status: DC | PRN
  Administered 2021-03-20: 30 ug/kg/min via INTRAVENOUS

## 2021-03-20 SURGICAL SUPPLY — 37 items
BNDG ELASTIC 4X5.8 VLCR STR LF (GAUZE/BANDAGES/DRESSINGS) ×2 IMPLANT
CANISTER SUCT 1200ML W/VALVE (MISCELLANEOUS) ×1 IMPLANT
CHLORAPREP W/TINT 26 (MISCELLANEOUS) ×3 IMPLANT
COVER WAND RF STERILE (DRAPES) ×2 IMPLANT
CUFF TOURN SGL QUICK 18X4 (TOURNIQUET CUFF) ×1 IMPLANT
DRAPE FLUOR MINI C-ARM 54X84 (DRAPES) ×2 IMPLANT
ELECT REM PT RETURN 9FT ADLT (ELECTROSURGICAL) ×2
ELECTRODE REM PT RTRN 9FT ADLT (ELECTROSURGICAL) ×1 IMPLANT
GAUZE SPONGE 4X4 12PLY STRL (GAUZE/BANDAGES/DRESSINGS) ×2 IMPLANT
GAUZE XEROFORM 1X8 LF (GAUZE/BANDAGES/DRESSINGS) ×4 IMPLANT
GLOVE SURG SYN 9.0  PF PI (GLOVE) ×2
GLOVE SURG SYN 9.0 PF PI (GLOVE) ×1 IMPLANT
GOWN SRG 2XL LVL 4 RGLN SLV (GOWNS) ×1 IMPLANT
GOWN STRL NON-REIN 2XL LVL4 (GOWNS) ×2
GOWN STRL REUS W/ TWL LRG LVL3 (GOWN DISPOSABLE) ×1 IMPLANT
GOWN STRL REUS W/TWL LRG LVL3 (GOWN DISPOSABLE) ×2
KIT TURNOVER KIT A (KITS) ×2 IMPLANT
MANIFOLD NEPTUNE II (INSTRUMENTS) ×2 IMPLANT
NDL FILTER BLUNT 18X1 1/2 (NEEDLE) ×1 IMPLANT
NEEDLE FILTER BLUNT 18X 1/2SAF (NEEDLE) ×1
NEEDLE FILTER BLUNT 18X1 1/2 (NEEDLE) ×1 IMPLANT
NS IRRIG 500ML POUR BTL (IV SOLUTION) ×2 IMPLANT
PACK EXTREMITY ARMC (MISCELLANEOUS) ×2 IMPLANT
PAD CAST CTTN 4X4 STRL (SOFTGOODS) ×2 IMPLANT
PADDING CAST COTTON 4X4 STRL (SOFTGOODS) ×2
PEG SUBCHONDRAL SMOOTH 2.0X14 (Peg) ×1 IMPLANT
PEG SUBCHONDRAL SMOOTH 2.0X16 (Peg) ×1 IMPLANT
PEG SUBCHONDRAL SMOOTH 2.0X18 (Peg) ×4 IMPLANT
PLATE SHORT 21.6X48.9 NRRW RT (Plate) ×1 IMPLANT
SCALPEL PROTECTED #15 DISP (BLADE) ×4 IMPLANT
SCREW CORT 3.5X10 LNG (Screw) ×2 IMPLANT
SPLINT CAST 1 STEP 3X12 (MISCELLANEOUS) ×2 IMPLANT
SUT ETHILON 4-0 (SUTURE) ×2
SUT ETHILON 4-0 FS2 18XMFL BLK (SUTURE) ×1
SUT VICRYL 3-0 27IN (SUTURE) ×2 IMPLANT
SUTURE ETHLN 4-0 FS2 18XMF BLK (SUTURE) ×1 IMPLANT
SYR 3ML LL SCALE MARK (SYRINGE) ×2 IMPLANT

## 2021-03-20 NOTE — Op Note (Signed)
03/20/2021  3:33 PM  PATIENT:  Kathleen Moses  55 y.o. female  PRE-OPERATIVE DIAGNOSIS:  Traumatic closed displaced fracture of distal end of radius, right, initial encounter S52.501A  POST-OPERATIVE DIAGNOSIS:  Traumatic closed displaced fracture of distal end of radius, right, initial encounter S52.501A  PROCEDURE:  Procedure(s): OPEN REDUCTION INTERNAL FIXATION (ORIF) DISTAL RADIAL FRACTURE (Right)  SURGEON: Leitha Schuller, MD  ASSISTANTS: None  ANESTHESIA:   general  EBL:  Total I/O In: 700 [I.V.:700] Out: 2 [Blood:2]  BLOOD ADMINISTERED:none  DRAINS: none   LOCAL MEDICATIONS USED:  NONE  SPECIMEN:  No Specimen  DISPOSITION OF SPECIMEN:  N/A  COUNTS:  YES  TOURNIQUET:   Total Tourniquet Time Documented: Upper Arm (Right) - 19 minutes Total: Upper Arm (Right) - 19 minutes   IMPLANTS: [employer] short narrow DVR plate with multiple smooth pegs and screws  DICTATION: .Dragon Dictation patient was brought to the operating room and after adequate anesthesia was obtained the right arm was prepped and draped in the usual sterile fashion.  After patient identification and timeout procedures were completed tourniquet was raised.  A volar incision was made over the FCR tendon after traction applied through the end of the table approximately 6 and half pounds of traction helped restore length.  After the FCR tendon was exposed the tendon sheath incised the tendon retracted radially to protect the radial artery and associated veins.  Deep fascia was then retracted and the pronator was elevated off the radial border and the fracture exposed.  With traction and the use of a Therapist, nutritional near anatomic alignment could be obtained based on mini C arm views a short narrow DVR plate was then applied and pinned into position with a distal first technique with 6 smooth pegs placed with no penetration into the joint.  The plate was then brought down to the shaft and this restored most  of the volar tilt and length was anatomic.  2 screw levels were placed with 10 mm screws to the distal 2 screw holes more proximal and was slightly offset towards the ulnar side and was not deemed necessary.  At this point the repeat removal of the weights off the end of the fingertrap traction was done and under fluoroscopic views the fracture was stable.  Tourniquet was let down and the wound irrigated with minimal bleeding present the wound was then closed with 3-0 Vicryl subcutaneously 4-0 nylon for the skin Xeroform 4 x 4's web roll volar splint and Ace wrap  PLAN OF CARE: Discharge to home after PACU  PATIENT DISPOSITION:  PACU - hemodynamically stable.

## 2021-03-20 NOTE — Anesthesia Procedure Notes (Signed)
Procedure Name: LMA Insertion Date/Time: 03/20/2021 2:47 PM Performed by: Henrietta Hoover, CRNA Pre-anesthesia Checklist: Patient identified, Emergency Drugs available, Suction available and Patient being monitored Patient Re-evaluated:Patient Re-evaluated prior to induction Oxygen Delivery Method: Simple face mask Preoxygenation: Pre-oxygenation with 100% oxygen Induction Type: IV induction Ventilation: Mask ventilation without difficulty LMA: LMA inserted LMA Size: 3.5 Dental Injury: Teeth and Oropharynx as per pre-operative assessment

## 2021-03-20 NOTE — Discharge Instructions (Addendum)
Keep arm elevated is much as possible. Work on finger motion is much as you can. Loosen Ace wrap if fingers swell. Pain medicine as directed. Ice to the back of the wrist today and tomorrow. Call office if you are having problems.  AMBULATORY SURGERY  DISCHARGE INSTRUCTIONS   1) The drugs that you were given will stay in your system until tomorrow so for the next 24 hours you should not:  A) Drive an automobile B) Make any legal decisions C) Drink any alcoholic beverage   2) You may resume regular meals tomorrow.  Today it is better to start with liquids and gradually work up to solid foods.  You may eat anything you prefer, but it is better to start with liquids, then soup and crackers, and gradually work up to solid foods.   3) Please notify your doctor immediately if you have any unusual bleeding, trouble breathing, redness and pain at the surgery site, drainage, fever, or pain not relieved by medication.    4) Additional Instructions:    Please contact your physician with any problems or Same Day Surgery at [phone], Monday through Friday 6 am to 4 pm, or [place] at [facility] number at [phone].

## 2021-03-20 NOTE — Anesthesia Postprocedure Evaluation (Signed)
Anesthesia Post Note  Patient: Kathleen Moses  Procedure(s) Performed: OPEN REDUCTION INTERNAL FIXATION (ORIF) DISTAL RADIAL FRACTURE (Right Wrist)  Patient location during evaluation: PACU Anesthesia Type: General Level of consciousness: awake and alert Pain management: pain level controlled Vital Signs Assessment: post-procedure vital signs reviewed and stable Respiratory status: spontaneous breathing and respiratory function stable Cardiovascular status: stable Anesthetic complications: no   No complications documented.   Last Vitals:  Vitals:   03/20/21 1608 03/20/21 1623  BP: (!) 159/90 (!) 151/83  Pulse: (!) 55   Resp: 11   Temp:    SpO2: 99%     Last Pain:  Vitals:   03/20/21 1626  TempSrc:   PainSc: 4                  [name]

## 2021-03-20 NOTE — Transfer of Care (Signed)
Immediate Anesthesia Transfer of Care Note  Patient: Kathleen Moses  Procedure(s) Performed: OPEN REDUCTION INTERNAL FIXATION (ORIF) DISTAL RADIAL FRACTURE (Right Wrist)  Patient Location: PACU  Anesthesia Type:General  Level of Consciousness: awake, drowsy and patient cooperative  Airway & Oxygen Therapy: Patient Spontanous Breathing and Patient connected to face mask oxygen  Post-op Assessment: Report given to RN and Post -op Vital signs reviewed and stable  Post vital signs: Reviewed and stable  Last Vitals:  Vitals Value Taken Time  BP 150/81 03/20/21 1538  Temp    Pulse 55 03/20/21 1544  Resp 17 03/20/21 1544  SpO2 100 % 03/20/21 1544  Vitals shown include unvalidated device data.  Last Pain:  Vitals:   03/20/21 1257  TempSrc: Temporal  PainSc: 2       Patients Stated Pain Goal: 0 (03/20/21 1257)  Complications: No complications documented.

## 2021-03-20 NOTE — Anesthesia Preprocedure Evaluation (Signed)
Anesthesia Evaluation  Patient identified by MRN, date of birth, ID band Patient awake    Reviewed: Allergy & Precautions, NPO status , Patient's Chart, lab work & pertinent test results  History of Anesthesia Complications (+) PONV and history of anesthetic complications  Airway Mallampati: II       Dental   Pulmonary neg sleep apnea, neg COPD, Not current smoker,           Cardiovascular (-) hypertension(-) Past MI and (-) CHF (-) dysrhythmias (-) Valvular Problems/Murmurs     Neuro/Psych neg Seizures Anxiety    GI/Hepatic Neg liver ROS, GERD (infrequenmt, meds prn)  Medicated,  Endo/Other  neg diabetes  Renal/GU negative Renal ROS     Musculoskeletal   Abdominal   Peds  Hematology   Anesthesia Other Findings   Reproductive/Obstetrics                            Anesthesia Physical Anesthesia Plan  ASA: II  Anesthesia Plan: General   Post-op Pain Management:    Induction: Intravenous  PONV Risk Score and Plan: 4 or greater and Ondansetron and Dexamethasone  Airway Management Planned: LMA  Additional Equipment:   Intra-op Plan:   Post-operative Plan:   Informed Consent: I have reviewed the patients History and Physical, chart, labs and discussed the procedure including the risks, benefits and alternatives for the proposed anesthesia with the patient or authorized representative who has indicated his/her understanding and acceptance.       Plan Discussed with:   Anesthesia Plan Comments:         Anesthesia Quick Evaluation

## 2021-03-20 NOTE — H&P (Signed)
Chief Complaint  Patient presents with  . Right wrist fracture   Kathleen Moses is a 55 y.o. female who presents today for Worker's Comp. injury to the right wrist that occurred on 03/14/2021. Patient works at the [employer], states she was walking and tripped over something and fell on her right outstretched hand. She is right-hand dominant. She denies any numbness or tingling. Pain has been well controlled but she is having moderate swelling and bruising throughout the wrist. Patient performs office work mostly typing. X-rays in the urgent care show comminuted impacted and dorsally displaced distal radial metaphyseal fracture.  Past Medical History: Past Medical History:  Diagnosis Date  . Allergy years ago  seasonal allergies, dust  . GAD (generalized anxiety disorder) 11/08/2019  . Gastroesophageal reflux disease without esophagitis 02/02/2020  . Pure hypercholesterolemia  . Sinus symptom   Past Surgical History: Past Surgical History:  Procedure Laterality Date  . DNC's   Past Family History: Family History  Problem Relation Age of Onset  . Pulmonary fibrosis Mother  . Depression Mother  . Lung cancer Mother  . Cancer Father  . Allergic rhinitis Father  . Colon polyps Father  . Diabetes type II Father  . Alzheimer's disease Paternal Grandmother  . Asthma Paternal Grandmother  . Colon cancer Paternal Grandmother  . Coronary Artery Disease (Blocked arteries around heart) Paternal Grandfather  . Stroke Paternal Grandfather  . Heart disease Paternal Grandfather  . Osteoporosis (Thinning of bones) Maternal Grandmother  . Skin cancer Paternal Aunt  . No Known Problems Brother   Medications: Current Outpatient Medications Ordered in Epic  Medication Sig Dispense Refill  . acetaminophen (TYLENOL) 325 MG tablet Take 650 mg by mouth every 4 (four) hours as needed for Pain  . cetirizine (ZYRTEC) 10 MG tablet Take 10 mg by mouth once daily.  . cholecalciferol (VITAMIN  D3) 1,000 unit tablet Take 1,000 Units by mouth once daily  . hydrOXYzine (ATARAX) 25 MG tablet 1TAB 2-3 TIMES DAILY PRN FOR ANXIETY (Patient not taking: Reported on 03/14/2021) 30 tablet 0  . ibuprofen (MOTRIN) 200 MG tablet Take 200 mg by mouth every 6 (six) hours as needed for Pain  . meloxicam (MOBIC) 15 MG tablet Take 1 tablet (15 mg total) by mouth once daily for 14 doses 14 tablet 0  . multivitamin/iron/folic acid (CENTRUM ORAL) Take by mouth  . traMADoL (ULTRAM) 50 mg tablet Take 1 tablet (50 mg total) by mouth every 6 (six) hours as needed for Pain [facility] for bedtime.) for up to 5 days (Patient not taking: Reported on 03/19/2021) 20 tablet 0   No current Epic-ordered facility-administered medications on file.   Allergies: Allergies  Allergen Reactions  . Codeine Nausea    Review of Systems:  A comprehensive 14 point ROS was performed, reviewed by me today, and the pertinent orthopaedic findings are documented in the HPI.  Exam: BP 118/80  Wt 57.6 kg (127 lb)  BMI 25.65 kg/m  General:  Well developed, well nourished, no apparent distress, normal affect, normal gait with no antalgic component.   HEENT: Head normocephalic, atraumatic, PERRL.   Abdomen: Soft, non tender, non distended, Bowel sounds present.  Heart: Examination of the heart reveals regular, rate, and rhythm. There is no murmur noted on ascultation. There is a normal apical pulse.  Lungs: Lungs are clear to auscultation. There is no wheeze, rhonchi, or crackles. There is normal expansion of bilateral chest walls.   Right upper extremity: Examination of the right wrist  shows moderate swelling, mild ecchymosis. Tender along the distal radial metaphysis. Wrist range of motion is limited. Normal sensation throughout the digits with close to full composite fist, lacks 1 cm full composite fist secondary to swelling throughout the digits.  AP lateral and oblique views of the right wrist are ordered interpreted by  me in the office appeared impression: Patient has impacted distal radial metaphyseal fracture with comminution that extends intra-articular. There is dorsal tilt and angulation of the distal radial metaphysis. There is slight shortening of the distal radial metaphysis  Impression: Traumatic closed displaced fracture of distal end of radius, right, initial encounter [S52.501A] Traumatic closed displaced fracture of distal end of radius, right, initial encounter (primary encounter diagnosis)  Plan:  32. 55 year old female with comminuted right distal radial metaphyseal fracture with slight shortening and dorsal angulation. She is right-hand dominant. Risks, benefits, complications of a right distal radius ORIF have been discussed with the patient. Patient has agreed and consented procedure with Dr. Kennedy Bucker. We will work on getting this approved by [employer]. 2. Patient placed into a Velcro wrist brace today in the office 3.  This note was generated in part with voice recognition software and I apologize for any typographical errors that were not detected and corrected.  Patience Musca MPA-C   Electronically signed by Patience Musca, PA at 03/19/2021 9:53 AM EDT  Reviewed  H+P. No changes noted.

## 2021-03-21 ENCOUNTER — Encounter: Payer: Self-pay | Admitting: Orthopedic Surgery

## 2021-11-24 ENCOUNTER — Other Ambulatory Visit: Payer: Self-pay | Admitting: Obstetrics and Gynecology

## 2021-11-24 DIAGNOSIS — Z1231 Encounter for screening mammogram for malignant neoplasm of breast: Secondary | ICD-10-CM

## 2021-12-29 NOTE — Progress Notes (Signed)
PCP: Idelle Crouch, MD   Chief Complaint  Patient presents with   Gynecologic Exam    Hot flashes every now and then, pain during intercourse    HPI:      Ms. Kathleen Moses is a 56 y.o. G0P0000 whose LMP was No LMP recorded. Patient is perimenopausal., presents today for her annual examination.  Her menses have been absent for 11 months, no spotting. Occas mild vasomotor sx. Neg EMB with Dr. Georgianne Fick 3/22 for AUB; hx of stable small LTO cyst.   Sex activity: single partner. She does have vaginal dryness, will try lubricants.  Last Pap: 12/15/19  Results were: no abnormalities /neg HPV DNA.  Hx of STDs: none; pt states she had cx dysplasia in past but I don't see record of this in [facility] or [facility] notes  Last mammogram: 01/07/21 Results were: normal--routine follow-up in 12 months; has appt 01/13/22 There is no FH of breast cancer. There is no FH of ovarian cancer. The patient does do self-breast exams.  Colonoscopy: 2019 with Dr. Allen Norris;  Repeat due after 10 years.   Tobacco use: The patient denies current or previous tobacco use. Alcohol use: none Exercise: moderately active  She does get adequate calcium and Vitamin D in her diet.  Labs with PCP.   Patient Active Problem List   Diagnosis Date Noted   Heartburn    Blood in urine 05/06/2020   Gastroesophageal reflux disease without esophagitis 02/02/2020   Dyspepsia 11/08/2019   GAD (generalized anxiety disorder) 11/08/2019   Pain in throat 11/08/2019   Colon cancer screening    Juvenile idiopathic scoliosis of thoracolumbar region 11/22/2017   Pure hypercholesterolemia 11/22/2017   Vitamin D deficiency 11/22/2017    Past Surgical History:  Procedure Laterality Date   COLONOSCOPY WITH PROPOFOL N/A 01/24/2018   Procedure: COLONOSCOPY WITH PROPOFOL;  Surgeon: [name], MD;  Location: [place];  Service: Endoscopy;  Laterality: N/A;   DILATION AND CURETTAGE OF UTERUS     ESOPHAGOGASTRODUODENOSCOPY (EGD)  WITH PROPOFOL N/A 07/01/2020   Procedure: ESOPHAGOGASTRODUODENOSCOPY (EGD) WITH PROPOFOL;  Surgeon: [name], MD;  Location: [place];  Service: Endoscopy;  Laterality: N/A;   HYSTEROSCOPY WITH D & C N/A 05/07/2017   Procedure: DILATATION AND CURETTAGE /HYSTEROSCOPY;  Surgeon: Malachy Mood, MD;  Location: [facility];  Service: Gynecology;  Laterality: N/A;   OPEN REDUCTION INTERNAL FIXATION (ORIF) DISTAL RADIAL FRACTURE Right 03/20/2021   Procedure: OPEN REDUCTION INTERNAL FIXATION (ORIF) DISTAL RADIAL FRACTURE;  Surgeon: Hessie Knows, MD;  Location: [facility];  Service: Orthopedics;  Laterality: Right;    Family History  Problem Relation Age of Onset   Lung disease Mother    Lung cancer Mother    Bladder Cancer Father    Kidney disease Father    Diabetes Father    Colon cancer Paternal Grandmother        6s   Breast cancer Neg Hx     Social History   Socioeconomic History   Marital status: Married    Spouse name: Not on file   Number of children: Not on file   Years of education: Not on file   Highest education level: Not on file  Occupational History   Not on file  Tobacco Use   Smoking status: Never   Smokeless tobacco: Never  Vaping Use   Vaping Use: Never used  Substance and Sexual Activity   Alcohol use: No   Drug use: No   Sexual activity:  Yes    Birth control/protection: None  Other Topics Concern   Not on file  Social History Narrative   Not on file   Social Determinants of Health   Financial Resource Strain: Not on file  Food Insecurity: Not on file  Transportation Needs: Not on file  Physical Activity: Not on file  Stress: Not on file  Social Connections: Not on file  Intimate Partner Violence: Not on file     Current Outpatient Medications:    acetaminophen (TYLENOL) 325 MG tablet, Take 650 mg by mouth every 6 (six) hours as needed for headache., Disp: , Rfl:    cetirizine (ZYRTEC) 10 MG tablet, Take 10 mg by mouth daily., Disp: ,  Rfl:    Cholecalciferol (VITAMIN D) 50 MCG (2000 UT) tablet, Take 2,000 Units by mouth daily., Disp: , Rfl:    ergocalciferol (VITAMIN D2) 1.25 MG (50000 UT) capsule, ergocalciferol (vitamin D2) 1,250 mcg (50,000 unit) capsule  TAKE 1 CAPSULE BY MOUTH ONCE A WEEK, Disp: , Rfl:    famotidine (PEPCID) 20 MG tablet, Take 20 mg by mouth daily as needed for heartburn or indigestion., Disp: , Rfl:    ibuprofen (ADVIL) 200 MG tablet, Take 200 mg by mouth every 4 (four) hours as needed for headache., Disp: , Rfl:    Multiple Vitamin (MULTIVITAMIN) capsule, Take 1 capsule by mouth daily., Disp: , Rfl:      ROS:  Review of Systems  Constitutional:  Negative for fatigue, fever and unexpected weight change.  Respiratory:  Negative for cough, shortness of breath and wheezing.   Cardiovascular:  Negative for chest pain, palpitations and leg swelling.  Gastrointestinal:  Negative for blood in stool, constipation, diarrhea, nausea and vomiting.  Endocrine: Negative for cold intolerance, heat intolerance and polyuria.  Genitourinary:  Positive for dyspareunia. Negative for dysuria, flank pain, frequency, genital sores, hematuria, menstrual problem, pelvic pain, urgency, vaginal bleeding, vaginal discharge and vaginal pain.  Musculoskeletal:  Negative for back pain, joint swelling and myalgias.  Skin:  Negative for rash.  Neurological:  Negative for dizziness, syncope, light-headedness, numbness and headaches.  Hematological:  Negative for adenopathy.  Psychiatric/Behavioral:  Negative for agitation, confusion, sleep disturbance and suicidal ideas. The patient is not nervous/anxious.   BREAST: No symptoms    Objective: BP 102/70    Ht 5\' 2"  (1.575 m)    Wt 137 lb (62.1 kg)    BMI 25.06 kg/m    Physical Exam Constitutional:      Appearance: She is well-developed.  Genitourinary:     Vulva normal.     Right Labia: No rash, tenderness or lesions.    Left Labia: No tenderness, lesions or rash.     No vaginal discharge, erythema or tenderness.      Right Adnexa: not tender and no mass present.    Left Adnexa: not tender and no mass present.    No cervical friability or polyp.     Uterus is not enlarged or tender.  Breasts:    Right: No mass, nipple discharge, skin change or tenderness.     Left: No mass, nipple discharge, skin change or tenderness.  Neck:     Thyroid: No thyromegaly.  Cardiovascular:     Rate and Rhythm: Normal rate and regular rhythm.     Heart sounds: Normal heart sounds. No murmur heard. Pulmonary:     Effort: Pulmonary effort is normal.     Breath sounds: Normal breath sounds.  Abdominal:     Palpations:  Abdomen is soft.     Tenderness: There is no abdominal tenderness. There is no guarding or rebound.  Musculoskeletal:        General: Normal range of motion.     Cervical back: Normal range of motion.  Lymphadenopathy:     Cervical: No cervical adenopathy.  Neurological:     General: No focal deficit present.     Mental Status: She is alert and oriented to person, place, and time.     Cranial Nerves: No cranial nerve deficit.  Skin:    General: Skin is warm and dry.  Psychiatric:        Mood and Affect: Mood normal.        Behavior: Behavior normal.        Thought Content: Thought content normal.        Judgment: Judgment normal.  Vitals reviewed.    Assessment/Plan:  Encounter for annual routine gynecological examination  Cervical cancer screening - Plan: Cytology - PAP  History of cervical dysplasia - Plan: Cytology - PAP; per pt report, repeat pap today  Encounter for screening mammogram for malignant neoplasm of breast; pt has appt scheduled.   Perimenopause--f/u prn PMB.         GYN counsel breast self exam, mammography screening, menopause, adequate intake of calcium and vitamin D, diet and exercise    F/U  Return in about 1 year (around 12/30/2022).  Kathleen Heath B. Minnetta Sandora, PA-C 12/30/2021 11:26 AM

## 2021-12-30 ENCOUNTER — Ambulatory Visit (INDEPENDENT_AMBULATORY_CARE_PROVIDER_SITE_OTHER): Payer: BC Managed Care – PPO | Admitting: Obstetrics and Gynecology

## 2021-12-30 ENCOUNTER — Encounter: Payer: Self-pay | Admitting: Obstetrics and Gynecology

## 2021-12-30 ENCOUNTER — Other Ambulatory Visit (HOSPITAL_COMMUNITY)
Admission: RE | Admit: 2021-12-30 | Discharge: 2021-12-30 | Disposition: A | Discharge status: Home / Self Care | Payer: BC Managed Care – PPO | Source: Ambulatory Visit | Attending: Obstetrics and Gynecology | Admitting: Obstetrics and Gynecology

## 2021-12-30 ENCOUNTER — Other Ambulatory Visit: Payer: Self-pay

## 2021-12-30 VITALS — BP 102/70 | Ht 62.0 in | Wt 137.0 lb

## 2021-12-30 DIAGNOSIS — Z1231 Encounter for screening mammogram for malignant neoplasm of breast: Secondary | ICD-10-CM

## 2021-12-30 DIAGNOSIS — Z124 Encounter for screening for malignant neoplasm of cervix: Secondary | ICD-10-CM | POA: Diagnosis present

## 2021-12-30 DIAGNOSIS — Z01419 Encounter for gynecological examination (general) (routine) without abnormal findings: Secondary | ICD-10-CM | POA: Diagnosis not present

## 2021-12-30 DIAGNOSIS — Z87410 Personal history of cervical dysplasia: Secondary | ICD-10-CM | POA: Diagnosis present

## 2021-12-30 DIAGNOSIS — Z1211 Encounter for screening for malignant neoplasm of colon: Secondary | ICD-10-CM

## 2021-12-30 NOTE — Patient Instructions (Signed)
I value your feedback and you entrusting us with your care. If you get a  patient survey, I would appreciate you taking the time to let us know about your experience today. Thank you! ? ? ?

## 2022-01-02 LAB — CYTOLOGY - PAP: Diagnosis: NEGATIVE

## 2022-01-12 ENCOUNTER — Telehealth: Payer: Self-pay

## 2022-01-12 NOTE — Telephone Encounter (Signed)
Pt calling to ask if HPV is part of pap smear.  [phone]  Pt aware HPV is a separate componant that is ordered with pap. ?

## 2022-01-13 ENCOUNTER — Other Ambulatory Visit: Payer: Self-pay

## 2022-01-13 ENCOUNTER — Ambulatory Visit
Admission: RE | Admit: 2022-01-13 | Discharge: 2022-01-13 | Disposition: A | Discharge status: Home / Self Care | Payer: BC Managed Care – PPO | Source: Ambulatory Visit | Attending: Obstetrics and Gynecology | Admitting: Obstetrics and Gynecology

## 2022-01-13 DIAGNOSIS — Z1231 Encounter for screening mammogram for malignant neoplasm of breast: Secondary | ICD-10-CM | POA: Insufficient documentation

## 2022-03-17 ENCOUNTER — Other Ambulatory Visit: Payer: Self-pay | Admitting: Internal Medicine

## 2022-03-17 DIAGNOSIS — K7689 Other specified diseases of liver: Secondary | ICD-10-CM

## 2022-06-05 ENCOUNTER — Ambulatory Visit
Admission: RE | Admit: 2022-06-05 | Discharge: 2022-06-05 | Disposition: A | Discharge status: Home / Self Care | Payer: BC Managed Care – PPO | Source: Ambulatory Visit | Attending: Internal Medicine | Admitting: Internal Medicine

## 2022-06-05 DIAGNOSIS — K7689 Other specified diseases of liver: Secondary | ICD-10-CM

## 2022-07-09 IMAGING — MG MM DIGITAL SCREENING BILAT W/ TOMO AND CAD
8 series · 8 of 24 positions shown · non-contrast
Comparison: Previous exam(s).

CLINICAL DATA: Screening.

EXAM:
DIGITAL SCREENING BILATERAL MAMMOGRAM WITH TOMOSYNTHESIS AND CAD
TECHNIQUE: Bilateral screening digital craniocaudal and mediolateral oblique
mammograms were obtained. Bilateral screening digital breast
tomosynthesis was performed. The images were evaluated with
computer-aided detection.

[R MLO synth-2D]
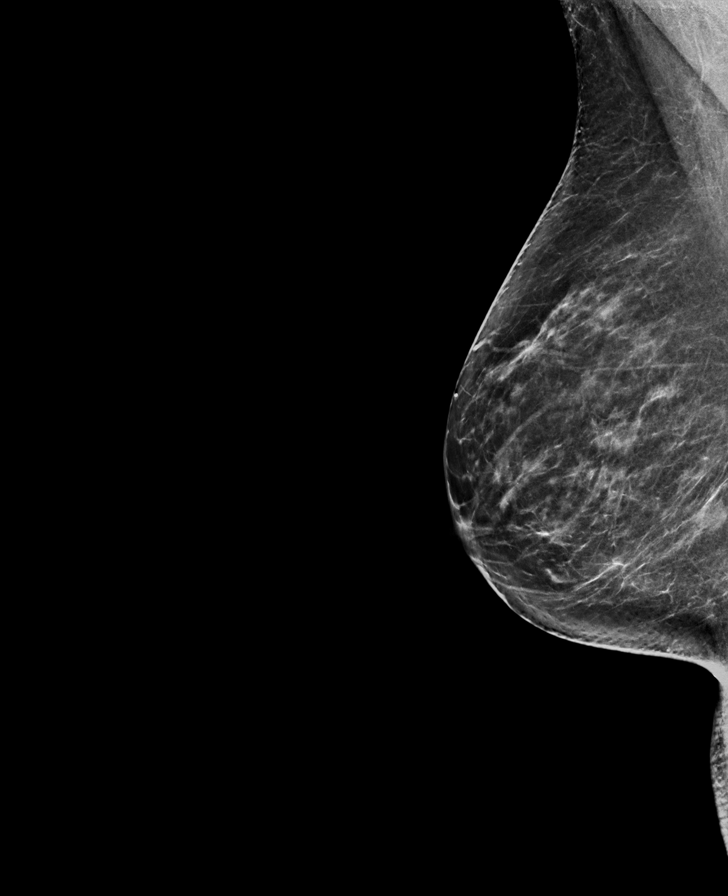

[L MLO synth-2D]
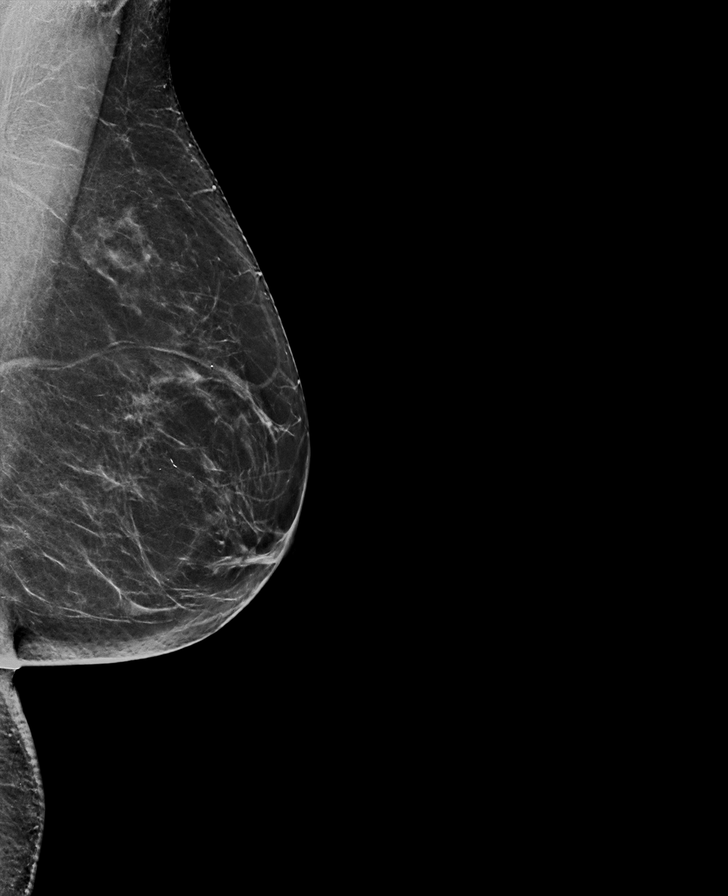

[R CC synth-2D]
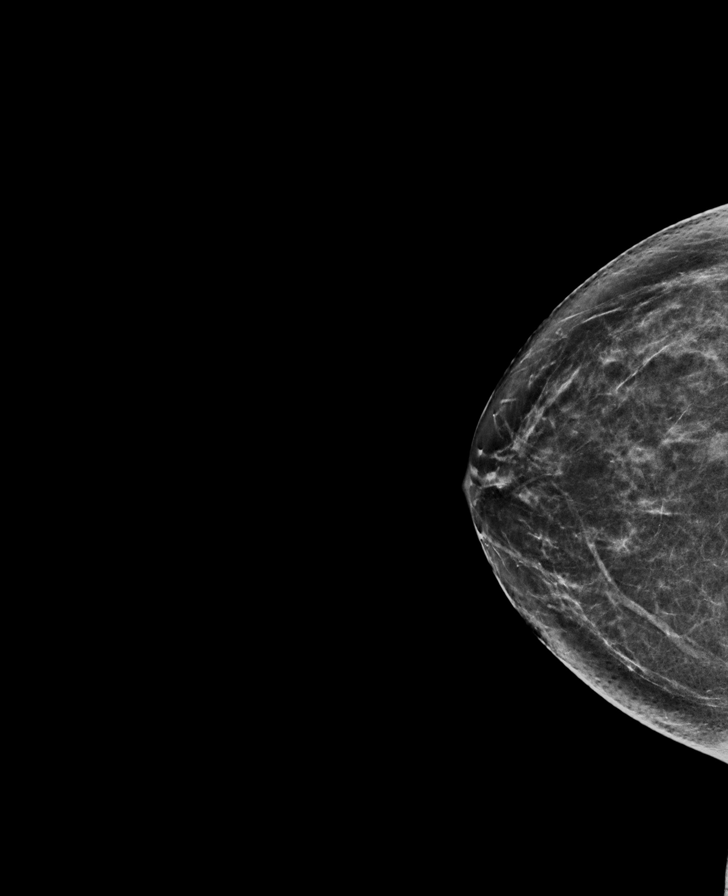

[L CC synth-2D]
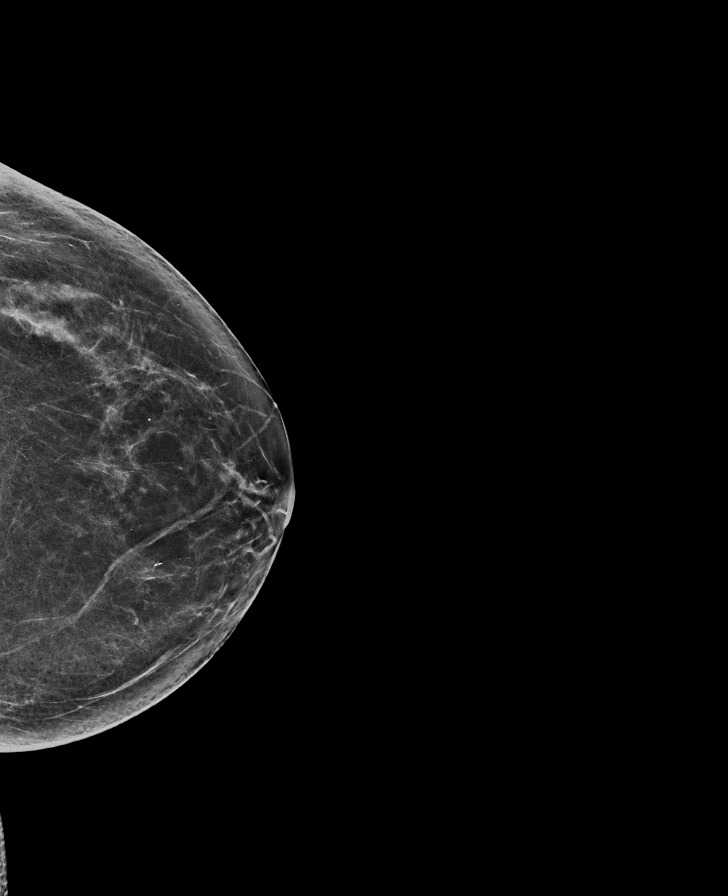

[L CC tomo · tomo slice 34/67.0]
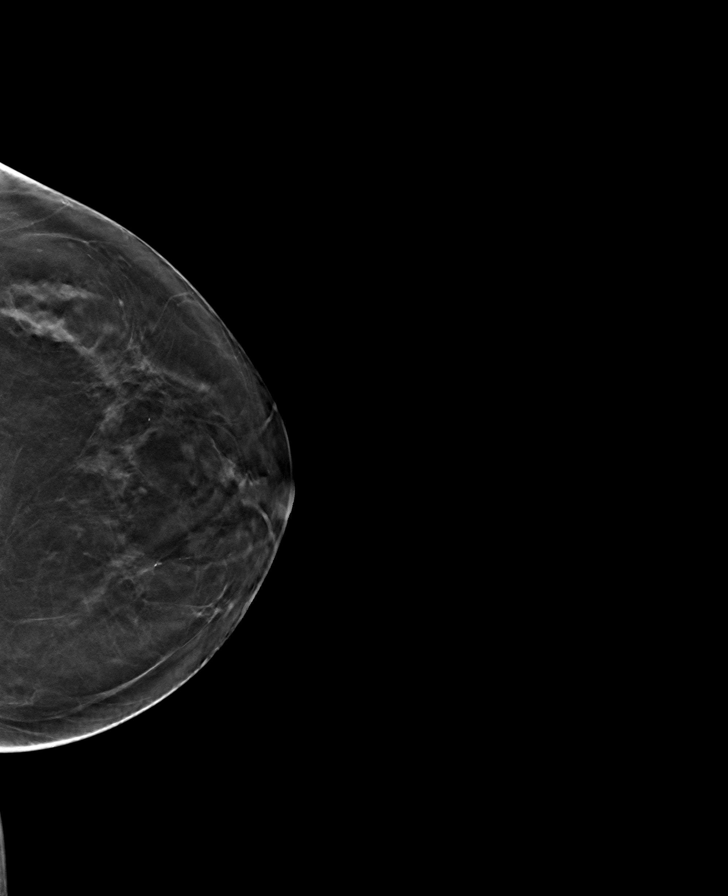

[R MLO tomo · tomo slice 36/71.0]
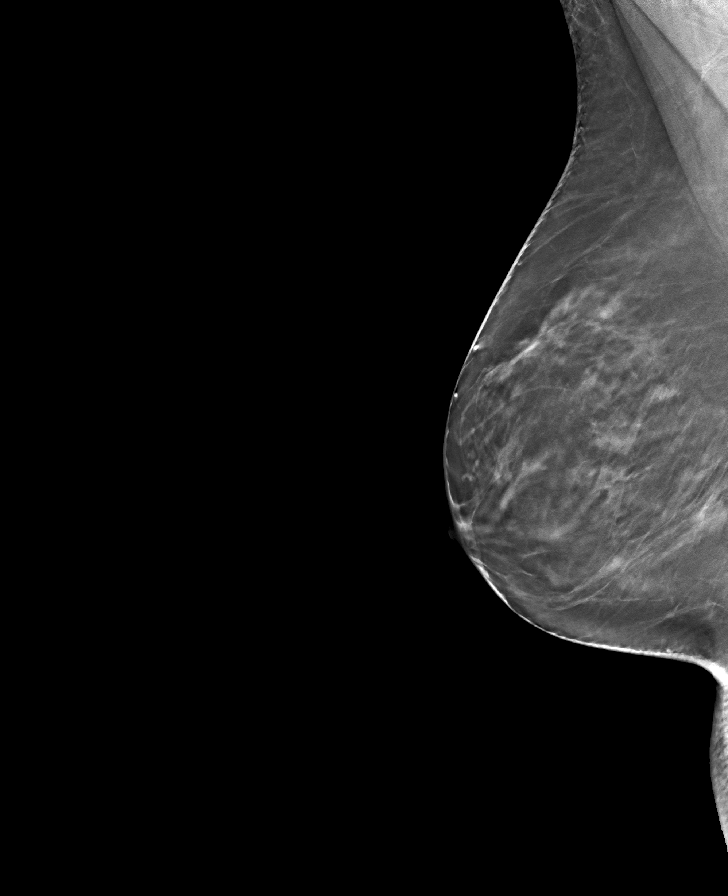

[R CC tomo · tomo slice 33/66.0]
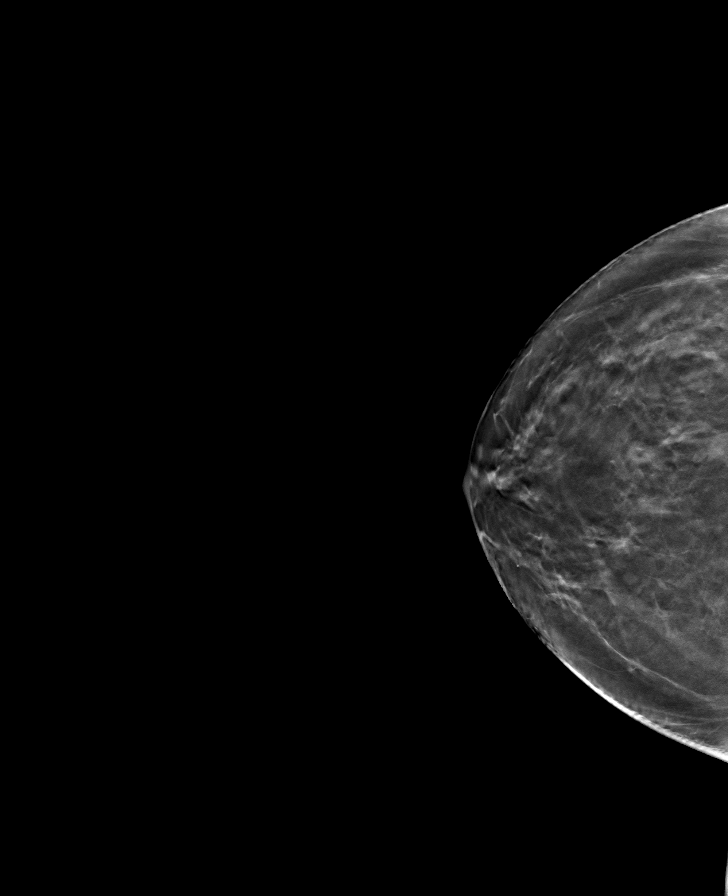

[L MLO tomo · tomo slice 35/69.0]
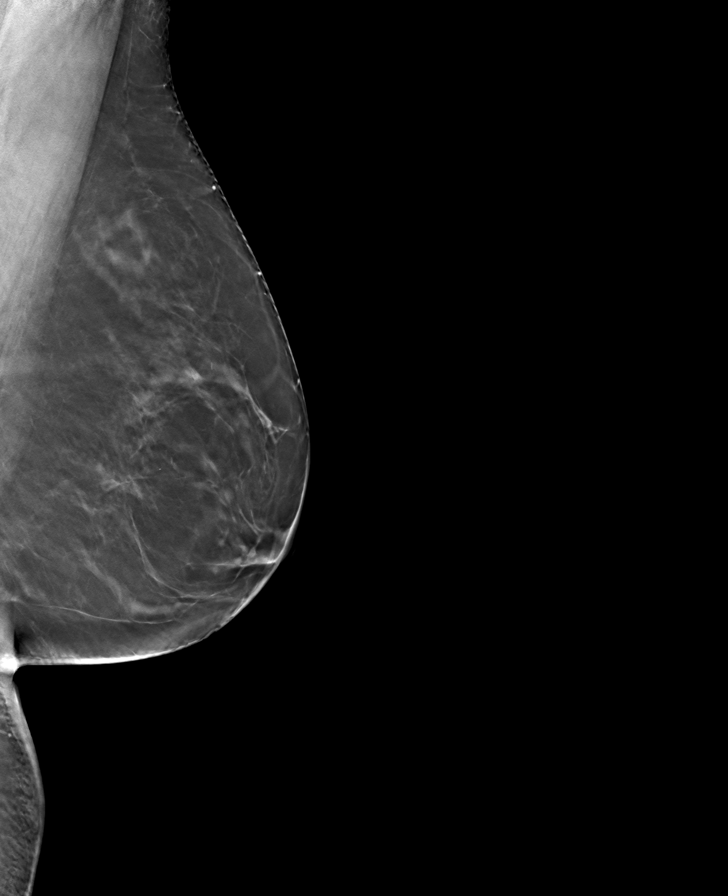

[8 of 24 positions shown; findings below may reference images not displayed]

ACR Breast Density Category b: There are scattered areas of
fibroglandular density.
FINDINGS: There are no findings suspicious for malignancy. The images were
evaluated with computer-aided detection.
IMPRESSION: No mammographic evidence of malignancy. A result letter of this
screening mammogram will be mailed directly to the patient.

RECOMMENDATION:
Screening mammogram in one year. (Code:WJ-[id number])

BI-RADS CATEGORY  1: Negative.

## 2023-01-08 ENCOUNTER — Other Ambulatory Visit: Payer: Self-pay | Admitting: Obstetrics and Gynecology

## 2023-01-08 DIAGNOSIS — Z1231 Encounter for screening mammogram for malignant neoplasm of breast: Secondary | ICD-10-CM

## 2023-01-18 ENCOUNTER — Ambulatory Visit
Admission: RE | Admit: 2023-01-18 | Discharge: 2023-01-18 | Disposition: A | Discharge status: Home / Self Care | Payer: BC Managed Care – PPO | Source: Ambulatory Visit | Attending: Obstetrics and Gynecology | Admitting: Obstetrics and Gynecology

## 2023-01-18 DIAGNOSIS — Z1231 Encounter for screening mammogram for malignant neoplasm of breast: Secondary | ICD-10-CM | POA: Insufficient documentation

## 2023-09-20 ENCOUNTER — Other Ambulatory Visit: Payer: Self-pay | Admitting: Internal Medicine

## 2023-09-20 DIAGNOSIS — Z1231 Encounter for screening mammogram for malignant neoplasm of breast: Secondary | ICD-10-CM

## 2024-01-20 ENCOUNTER — Ambulatory Visit
Admission: RE | Admit: 2024-01-20 | Discharge: 2024-01-20 | Disposition: A | Discharge status: Home / Self Care | Payer: Self-pay | Source: Ambulatory Visit | Attending: Internal Medicine | Admitting: Internal Medicine

## 2024-01-20 DIAGNOSIS — Z1231 Encounter for screening mammogram for malignant neoplasm of breast: Secondary | ICD-10-CM | POA: Insufficient documentation
